# Patient Record
Sex: Female | Born: 1935 | Race: White | Hispanic: No | Smoking: Light tobacco smoker
Health system: Southern US, Community
[De-identification: ages and names within clinical notes are randomized; demographics above are authoritative.]

## PROBLEM LIST (undated history)

## (undated) DIAGNOSIS — E119 Type 2 diabetes mellitus without complications: Secondary | ICD-10-CM

## (undated) DIAGNOSIS — I10 Essential (primary) hypertension: Secondary | ICD-10-CM

## (undated) HISTORY — PX: ABDOMINAL HYSTERECTOMY: SHX81

---

## 2004-12-29 ENCOUNTER — Ambulatory Visit: Payer: Self-pay | Admitting: General Surgery

## 2005-01-04 ENCOUNTER — Ambulatory Visit: Payer: Self-pay | Admitting: General Surgery

## 2005-03-02 ENCOUNTER — Inpatient Hospital Stay: Payer: Self-pay | Admitting: General Surgery

## 2006-02-27 ENCOUNTER — Ambulatory Visit: Payer: Self-pay | Admitting: General Surgery

## 2006-03-28 ENCOUNTER — Other Ambulatory Visit: Payer: Self-pay

## 2006-04-05 ENCOUNTER — Inpatient Hospital Stay: Payer: Self-pay | Admitting: General Surgery

## 2008-06-16 ENCOUNTER — Inpatient Hospital Stay: Payer: Self-pay | Admitting: Vascular Surgery

## 2009-03-18 ENCOUNTER — Inpatient Hospital Stay: Payer: Self-pay | Admitting: Vascular Surgery

## 2009-06-01 ENCOUNTER — Ambulatory Visit: Payer: Self-pay | Admitting: Gastroenterology

## 2009-09-23 ENCOUNTER — Ambulatory Visit: Payer: Self-pay | Admitting: Ophthalmology

## 2011-01-24 ENCOUNTER — Ambulatory Visit: Payer: Self-pay | Admitting: Vascular Surgery

## 2013-04-01 ENCOUNTER — Ambulatory Visit: Payer: Self-pay | Admitting: Vascular Surgery

## 2013-04-01 LAB — BASIC METABOLIC PANEL
Anion Gap: 6 — ABNORMAL LOW (ref 7–16)
Calcium, Total: 9.5 mg/dL (ref 8.5–10.1)
Chloride: 105 mmol/L (ref 98–107)
Creatinine: 1.08 mg/dL (ref 0.60–1.30)
EGFR (Non-African Amer.): 46 — ABNORMAL LOW
Glucose: 227 mg/dL — ABNORMAL HIGH (ref 65–99)
Osmolality: 288 (ref 275–301)
Potassium: 4 mmol/L (ref 3.5–5.1)

## 2014-10-31 NOTE — Op Note (Signed)
PATIENT NAME:  Carol Kramer, Carol Kramer MR#:  [id number] DATE OF BIRTH:  01/10/1924  DATE OF PROCEDURE:  04/01/2013  PREOPERATIVE DIAGNOSES:  1.  Peripheral arterial disease with a very short distance claudication, bilateral lower extremities.  2.  Tobacco dependence.  3.  Hypertension.  4.  Diabetes.   POSTOPERATIVE DIAGNOSES:  1.  Peripheral arterial disease with a very short distance claudication, bilateral lower extremities.  2.  Tobacco dependence.  3.  Hypertension.  4.  Diabetes.   PROCEDURE:  1.  Ultrasound guidance for vascular access, left femoral artery.  2.  Catheter placement into right posterior tibial artery from left femoral approach.  3.  Aortogram and selective right lower extremity angiogram. 4.  Percutaneous transluminal angioplasty of left distal external iliac artery with 5 mm diameter angioplasty balloon.  5.  Percutaneous transluminal angioplasty of right distal popliteal artery, tibioperoneal trunk and posterior tibial artery with 3 mm diameter angioplasty balloon.  6.  Percutaneous transluminal angioplasty of right popliteal artery and entire length of superficial femoral artery with 5 mm diameter angioplasty balloon.  7.  Two self-expanding stent placements in the right superficial femoral artery for greater than 50% residual stenosis, dissection and arteriovenous fistula after angioplasty.  8.  Percutaneous transluminal angioplasty of right external iliac artery with 7 mm diameter angioplasty balloon.  9.  Self-expanding stent placement to right external iliac artery for greater than 50% residual stenosis after angioplasty.  10. StarClose closure device, left femoral artery.   SURGEON: Festus Barren, M.D.   ANESTHESIA: Local with moderate conscious sedation.   ESTIMATED BLOOD LOSS: 25 mL.  CONTRAST USED: 95 mL Visipaque.   FLUOROSCOPY TIME: About 15 minutes.   INDICATION FOR PROCEDURE: An 81 year old white female with extensive peripheral vascular disease. She has  had previous interventions in years past. She has very advanced severe disease and now has pain with any activity. It is difficult to discern if  she has true ischemic rest pain as her ABIs are quite low and her disease is quite extensive. She has become debilitated to the point where she is unable to walk at all at this point and asks if there is anything we would be able to do for revascularization. I told her that our revascularization options may be limited, but we could certainly perform and angiogram and see what options would be available. Both legs are affected and we are evaluating and treating. We are looking at the right lower extremity to see if any revascularization options exist. Risks and benefits were discussed. Informed consent was obtained.   DESCRIPTION OF PROCEDURE: The patient is brought to the vascular interventional radiology suite. The groin was shaved and prepped and a sterile surgical field was created. The left femoral head was localized with fluoroscopy. Ultrasound was used to visualize the left femoral artery. This was accessed and a micropuncture wire was placed. A micropuncture sheath was then placed and we were able to up size it to a 5-French sheath. Initially the J-wire would not track and on imaging it was seen that the distal external iliac artery on the left had a high-grade stenosis. Before up sizing to a larger sheath and before treating the right, we angioplastied this area with a 5 mm diameter angioplasty balloon pulling the sheath back. This still left a 50% residual stenosis, but it was improved and we were not in any location to place a stent from the left femoral approach. We then put a pigtail catheter in the aorta and aortogram  was performed. This demonstrated very calcific aorta and iliac vessels. The right external iliac artery and its origin had about a 60% to 70% stenosis. I then hooked the aortic bifurcation and advanced to the right femoral head. Selective right  lower extremity was then performed. This showed a flush occlusion of the right superficial femoral artery with essentially no runoff distally. The distal anterior tibial artery did reconstitute, but her flow was quite poor. A 6-French Ansell sheath was placed over a Terumo Advantage wire and we tediously tried to cross the lesions. I was able to gain access to the superficial femoral artery. I got a catheter all the way down without too much difficulty into the popliteal artery. At this location,  I could not traverse into the anterior tibial artery, but the wire preferentially went into the posterior tibial artery on many passes. I elected to go ahead and treat this down as I could get a wire all the way down to the ankle. We exchanged for an 0.018 wire and a CXI catheter.   A 3 mm diameter angioplasty balloon was then inflated from just above the ankle, the posterior tibial artery up through the tibioperoneal trunk and into the distal popliteal artery. This did seem to improve the runoff and mostly through collaterals in the lower part of the leg. A 5 mm diameter angioplasty balloon was inflated from the below-knee popliteal artery up to the common femoral artery. Following this, the entire superficial femoral artery was essentially dissection with stenosis and an AV fistula. I exchanged back for the a 0.35 wire and placed 2 long 6 mm diameter stents basically from Hunter's canal to the proximal superficial femoral artery 2 to 3 cm beyond its origin. This was a better, although there was still some residual disease proximally and some finding that may be fistula residual. The flow did seem to be improved.   I then turned my attention to the iliac lesion. A 7 mm diameter angioplasty balloon was used to treat the right iliac lesion. This resulted in no improvement, so an 8 mm diameter x 6 cm length stent was placed and then ironed out with a 7 mm balloon with significantly improved flow and about a 30% residual  stenosis. At this point, I elected to terminate the procedure. The sheath was removed. StarClose closure device was deployed in the usual fashion under fluoroscopic guidance to avoid catching on the distal external iliac lesion we had previously treated. The patient tolerated the procedure well and was taken to the recovery room in stable condition.    ____________________________ Annice NeedyJason S. Pheonix Wisby, MD jsd:aw D: 04/01/2013 13:19:37 ET T: 04/01/2013 14:10:59 ET JOB#: [id number]  cc: Annice NeedyJason S. Termaine Roupp, MD, <Dictator> Annice NeedyJASON S Mattalynn Crandle MD ELECTRONICALLY SIGNED 04/03/2013 12:58

## 2016-06-08 ENCOUNTER — Other Ambulatory Visit: Payer: Self-pay | Admitting: Family Medicine

## 2016-06-08 DIAGNOSIS — I639 Cerebral infarction, unspecified: Secondary | ICD-10-CM

## 2016-06-09 ENCOUNTER — Inpatient Hospital Stay
Admission: EM | Admit: 2016-06-09 | Discharge: 2016-06-11 | DRG: 066 | Disposition: A | Discharge status: Home Health | Payer: Medicare Other | Attending: Internal Medicine | Admitting: Internal Medicine

## 2016-06-09 ENCOUNTER — Emergency Department: Payer: Medicare Other

## 2016-06-09 DIAGNOSIS — E1151 Type 2 diabetes mellitus with diabetic peripheral angiopathy without gangrene: Secondary | ICD-10-CM | POA: Diagnosis present

## 2016-06-09 DIAGNOSIS — R402362 Coma scale, best motor response, obeys commands, at arrival to emergency department: Secondary | ICD-10-CM | POA: Diagnosis present

## 2016-06-09 DIAGNOSIS — Z7902 Long term (current) use of antithrombotics/antiplatelets: Secondary | ICD-10-CM | POA: Diagnosis not present

## 2016-06-09 DIAGNOSIS — R402252 Coma scale, best verbal response, oriented, at arrival to emergency department: Secondary | ICD-10-CM | POA: Diagnosis present

## 2016-06-09 DIAGNOSIS — H538 Other visual disturbances: Secondary | ICD-10-CM | POA: Diagnosis present

## 2016-06-09 DIAGNOSIS — R42 Dizziness and giddiness: Secondary | ICD-10-CM | POA: Diagnosis present

## 2016-06-09 DIAGNOSIS — Z7984 Long term (current) use of oral hypoglycemic drugs: Secondary | ICD-10-CM | POA: Diagnosis not present

## 2016-06-09 DIAGNOSIS — E785 Hyperlipidemia, unspecified: Secondary | ICD-10-CM | POA: Diagnosis present

## 2016-06-09 DIAGNOSIS — Z7982 Long term (current) use of aspirin: Secondary | ICD-10-CM | POA: Diagnosis not present

## 2016-06-09 DIAGNOSIS — I63532 Cerebral infarction due to unspecified occlusion or stenosis of left posterior cerebral artery: Secondary | ICD-10-CM | POA: Diagnosis not present

## 2016-06-09 DIAGNOSIS — R4182 Altered mental status, unspecified: Secondary | ICD-10-CM | POA: Diagnosis present

## 2016-06-09 DIAGNOSIS — R2681 Unsteadiness on feet: Secondary | ICD-10-CM | POA: Diagnosis present

## 2016-06-09 DIAGNOSIS — Z8249 Family history of ischemic heart disease and other diseases of the circulatory system: Secondary | ICD-10-CM

## 2016-06-09 DIAGNOSIS — I639 Cerebral infarction, unspecified: Secondary | ICD-10-CM | POA: Diagnosis present

## 2016-06-09 DIAGNOSIS — R29702 NIHSS score 2: Secondary | ICD-10-CM | POA: Diagnosis present

## 2016-06-09 DIAGNOSIS — H53461 Homonymous bilateral field defects, right side: Secondary | ICD-10-CM

## 2016-06-09 DIAGNOSIS — R402142 Coma scale, eyes open, spontaneous, at arrival to emergency department: Secondary | ICD-10-CM | POA: Diagnosis present

## 2016-06-09 DIAGNOSIS — F17200 Nicotine dependence, unspecified, uncomplicated: Secondary | ICD-10-CM | POA: Diagnosis present

## 2016-06-09 DIAGNOSIS — I10 Essential (primary) hypertension: Secondary | ICD-10-CM | POA: Diagnosis present

## 2016-06-09 HISTORY — DX: Type 2 diabetes mellitus without complications: E11.9

## 2016-06-09 HISTORY — DX: Essential (primary) hypertension: I10

## 2016-06-09 LAB — URINALYSIS COMPLETE WITH MICROSCOPIC (ARMC ONLY)
BILIRUBIN URINE: NEGATIVE
GLUCOSE, UA: 50 mg/dL — AB
Hgb urine dipstick: NEGATIVE
KETONES UR: NEGATIVE mg/dL
NITRITE: NEGATIVE
Protein, ur: NEGATIVE mg/dL
Specific Gravity, Urine: 1.015 (ref 1.005–1.030)
pH: 5 (ref 5.0–8.0)

## 2016-06-09 LAB — GLUCOSE, CAPILLARY
GLUCOSE-CAPILLARY: 121 mg/dL — AB (ref 65–99)
Glucose-Capillary: 75 mg/dL (ref 65–99)
Glucose-Capillary: 167 mg/dL — ABNORMAL HIGH (ref 65–99)

## 2016-06-09 LAB — CBC
HCT: 46.6 % (ref 35.0–47.0)
Hemoglobin: 15.7 g/dL (ref 12.0–16.0)
MCH: 28.6 pg (ref 26.0–34.0)
MCHC: 33.8 g/dL (ref 32.0–36.0)
MCV: 84.5 fL (ref 80.0–100.0)
Platelets: 196 10*3/uL (ref 150–440)
RBC: 5.51 MIL/uL — ABNORMAL HIGH (ref 3.80–5.20)
RDW: 13.5 % (ref 11.5–14.5)
WBC: 6.2 10*3/uL (ref 3.6–11.0)

## 2016-06-09 LAB — BASIC METABOLIC PANEL
ANION GAP: 10 (ref 5–15)
BUN: 54 mg/dL — AB (ref 6–20)
CALCIUM: 9.3 mg/dL (ref 8.9–10.3)
CO2: 27 mmol/L (ref 22–32)
Chloride: 100 mmol/L — ABNORMAL LOW (ref 101–111)
Creatinine, Ser: 1.3 mg/dL — ABNORMAL HIGH (ref 0.44–1.00)
GFR calc Af Amer: 41 mL/min — ABNORMAL LOW (ref >60)
GFR, EST NON AFRICAN AMERICAN: 35 mL/min — AB (ref >60)
GLUCOSE: 99 mg/dL (ref 65–99)
Potassium: 4.1 mmol/L (ref 3.5–5.1)
SODIUM: 137 mmol/L (ref 135–145)

## 2016-06-09 LAB — TROPONIN I: Troponin I: <0.03 ng/mL (ref <0.03)

## 2016-06-09 MED ORDER — GLIPIZIDE 5 MG PO TABS
5.0000 mg | ORAL_TABLET | Freq: Every day | ORAL | Status: DC
  Filled 2016-06-09: qty 1

## 2016-06-09 MED ORDER — ACETAMINOPHEN 325 MG PO TABS: 650.0000 mg | ORAL_TABLET | ORAL | Status: DC | PRN

## 2016-06-09 MED ORDER — STROKE: EARLY STAGES OF RECOVERY BOOK
Freq: Once | Status: AC
  Administered 2016-06-09: 17:00:00

## 2016-06-09 MED ORDER — CLOPIDOGREL BISULFATE 75 MG PO TABS: 75.0000 mg | ORAL_TABLET | Freq: Every day | ORAL | Status: DC

## 2016-06-09 MED ORDER — ENOXAPARIN SODIUM 30 MG/0.3ML ~~LOC~~ SOLN
30.0000 mg | SUBCUTANEOUS | Status: DC
  Administered 2016-06-09 – 2016-06-10 (×2): 30 mg via SUBCUTANEOUS
  Filled 2016-06-09 (×3): qty 0.3

## 2016-06-09 MED ORDER — ASPIRIN EC 81 MG PO TBEC
81.0000 mg | DELAYED_RELEASE_TABLET | Freq: Every day | ORAL | Status: DC
  Administered 2016-06-10 – 2016-06-11 (×2): 81 mg via ORAL
  Filled 2016-06-09 (×3): qty 1

## 2016-06-09 MED ORDER — METFORMIN HCL 850 MG PO TABS
850.0000 mg | ORAL_TABLET | Freq: Two times a day (BID) | ORAL | Status: DC
  Administered 2016-06-09 – 2016-06-11 (×4): 850 mg via ORAL
  Filled 2016-06-09 (×4): qty 1

## 2016-06-09 MED ORDER — LABETALOL HCL 5 MG/ML IV SOLN
10.0000 mg | INTRAVENOUS | Status: DC | PRN
  Administered 2016-06-10: 01:00:00 10 mg via INTRAVENOUS
  Filled 2016-06-09 (×2): qty 4

## 2016-06-09 MED ORDER — SODIUM CHLORIDE 0.9 % IV BOLUS (SEPSIS)
1000.0000 mL | Freq: Once | INTRAVENOUS | Status: AC
  Administered 2016-06-09: 1000 mL via INTRAVENOUS

## 2016-06-09 MED ORDER — PRAVASTATIN SODIUM 40 MG PO TABS
40.0000 mg | ORAL_TABLET | Freq: Every day | ORAL | Status: DC
  Administered 2016-06-09 – 2016-06-10 (×2): 40 mg via ORAL
  Filled 2016-06-09 (×2): qty 1

## 2016-06-09 MED ORDER — OMEGA-3-ACID ETHYL ESTERS 1 G PO CAPS
1.0000 g | ORAL_CAPSULE | Freq: Every day | ORAL | Status: DC
Start: 2016-06-09 — End: 2016-06-11
  Administered 2016-06-09 – 2016-06-11 (×3): 1 g via ORAL
  Filled 2016-06-09 (×3): qty 1

## 2016-06-09 MED ORDER — CLOPIDOGREL BISULFATE 75 MG PO TABS
75.0000 mg | ORAL_TABLET | Freq: Every day | ORAL | Status: DC
  Administered 2016-06-09 – 2016-06-11 (×3): 75 mg via ORAL
  Filled 2016-06-09 (×3): qty 1

## 2016-06-09 MED ORDER — ACETAMINOPHEN 650 MG RE SUPP: 650.0000 mg | RECTAL | Status: DC | PRN

## 2016-06-09 MED ORDER — HYDROCHLOROTHIAZIDE 25 MG PO TABS
25.0000 mg | ORAL_TABLET | Freq: Every day | ORAL | Status: DC
  Administered 2016-06-10 – 2016-06-11 (×2): 25 mg via ORAL
  Filled 2016-06-09 (×2): qty 1

## 2016-06-09 MED ORDER — INSULIN ASPART 100 UNIT/ML ~~LOC~~ SOLN
0.0000 [IU] | Freq: Three times a day (TID) | SUBCUTANEOUS | Status: DC
  Administered 2016-06-09: 18:00:00 2 [IU] via SUBCUTANEOUS
  Administered 2016-06-10: 1 [IU] via SUBCUTANEOUS
  Administered 2016-06-10: 3 [IU] via SUBCUTANEOUS
  Administered 2016-06-10: 17:00:00 2 [IU] via SUBCUTANEOUS
  Administered 2016-06-11 (×2): 1 [IU] via SUBCUTANEOUS
  Filled 2016-06-09 (×3): qty 1
  Filled 2016-06-09: qty 3
  Filled 2016-06-09 (×2): qty 2

## 2016-06-09 MED ORDER — PIOGLITAZONE HCL 15 MG PO TABS
45.0000 mg | ORAL_TABLET | Freq: Every day | ORAL | Status: DC
  Administered 2016-06-10 – 2016-06-11 (×2): 45 mg via ORAL
  Filled 2016-06-09: qty 3
  Filled 2016-06-09: qty 1
  Filled 2016-06-09 (×2): qty 3

## 2016-06-09 MED ORDER — LISINOPRIL 20 MG PO TABS
40.0000 mg | ORAL_TABLET | Freq: Every day | ORAL | Status: DC
  Administered 2016-06-09 – 2016-06-11 (×3): 40 mg via ORAL
  Filled 2016-06-09 (×3): qty 2

## 2016-06-09 MED ORDER — ASPIRIN 81 MG PO CHEW
324.0000 mg | CHEWABLE_TABLET | Freq: Once | ORAL | Status: AC
  Administered 2016-06-09: 324 mg via ORAL
  Filled 2016-06-09: qty 4

## 2016-06-09 NOTE — ED Provider Notes (Signed)
[facility] Emergency Department Provider Note  ____________________________________________  Time seen: Approximately 12:11 PM  I have reviewed the triage vital signs and the nursing notes.   HISTORY  Chief Complaint Extremity Weakness   HPI Carol Kramer is a 82 y.o. female with a history of non-insulin-dependent diabetes, hypertension, hyperlipidemia, PVD on plavix who presents for evaluation of altered mental status. According to the family patient has been confused for the last 5 days. She saw her PCP 2 days ago and there was some concern the patient had had a stroke as she was noted by family members to have a right-sided weakness. Patient has no history of prior strokes. She is on Plavix. Patient was not taking her medications over the weekend as well. Patient tells me that she feels very confused for the last 3 days but endorses compliance with her medications. She denies any weakness at this time, headache, chest pain, shortness of breath, abdominal pain, nausea, vomiting, diarrhea, dysuria, hematuria, fever. Today patient's niece noticed that she had slurred speech and right-sided weakness which prompted EMS to be called. When EMS arrived patient did not meet stroke criteria per their evaluation.  Past Medical History:  Diagnosis Date  . Diabetes mellitus without complication (HCC)   . Hypertension     There are no active problems to display for this patient.   Past Surgical History:  Procedure Laterality Date  . ABDOMINAL HYSTERECTOMY      Prior to Admission medications   Not on File    Allergies Patient has no known allergies.  No family history on file.  Social History Social History  Substance Use Topics  . Smoking status: Light Tobacco Smoker  . Smokeless tobacco: Not on file  . Alcohol use No    Review of Systems  Constitutional: Negative for fever. + confusion Eyes: Negative for visual changes. ENT: Negative for sore  throat. Neck: No neck pain  Cardiovascular: Negative for chest pain. Respiratory: Negative for shortness of breath. Gastrointestinal: Negative for abdominal pain, vomiting or diarrhea. Genitourinary: Negative for dysuria. Musculoskeletal: Negative for back pain. Skin: Negative for rash. Neurological: Negative for headaches, weakness or numbness. Psych: No SI or HI  ____________________________________________   PHYSICAL EXAM:  VITAL SIGNS: ED Triage Vitals  Enc Vitals Group     BP 06/09/16 1149 (!) 174/80     Pulse Rate 06/09/16 1149 74     Resp 06/09/16 1149 16     Temp 06/09/16 1149 98.3 F (36.8 C)     Temp Source 06/09/16 1149 Oral     SpO2 06/09/16 1149 95 %     Weight 06/09/16 1150 140 lb (63.5 kg)     Height 06/09/16 1150 5\' 4"  (1.626 m)     Head Circumference --      Peak Flow --      Pain Score 06/09/16 1150 0     Pain Loc --      Pain Edu? --      Excl. in GC? --     Constitutional: Alert and oriented x 2. Well appearing and in no apparent distress. HEENT:      Head: Normocephalic and atraumatic.         Eyes: Conjunctivae are normal. Sclera is non-icteric. EOMI. PERRL      Mouth/Throat: Mucous membranes are dry.       Neck: Supple with no signs of meningismus. Cardiovascular: Regular rate and rhythm. No murmurs, gallops, or rubs. 2+ symmetrical distal pulses  are present in all extremities. No JVD. Respiratory: Normal respiratory effort. Lungs are clear to auscultation bilaterally. No wheezes, crackles, or rhonchi.  Gastrointestinal: Soft, non tender, and non distended with positive bowel sounds. No rebound or guarding. Genitourinary: No CVA tenderness. Musculoskeletal: Nontender with normal range of motion in all extremities. No edema, cyanosis, or erythema of extremities. Neurologic: Normal speech and language. A & O x2, PERRL, no nystagmus, CN II-XII intact, motor testing reveals good tone and bulk throughout. There is no evidence of pronator drift or  dysmetria. Muscle strength is 5/5 throughout. Deep tendon reflexes are 2+ throughout with downgoing toes. Sensory examination is intact. Gait deferred. Skin: Skin is warm, dry and intact. No rash noted. Psychiatric: Mood and affect are normal. Speech and behavior are normal.  ____________________________________________   LABS (all labs ordered are listed, but only abnormal results are displayed)  Labs Reviewed  BASIC METABOLIC PANEL - Abnormal; Notable for the following:       Result Value   Chloride 100 (*)    BUN 54 (*)    Creatinine, Ser 1.30 (*)    GFR calc non Af Amer 35 (*)    GFR calc Af Amer 41 (*)    All other components within normal limits  CBC - Abnormal; Notable for the following:    RBC 5.51 (*)    All other components within normal limits  GLUCOSE, CAPILLARY  TROPONIN I  URINALYSIS COMPLETEWITH MICROSCOPIC ([facility] ONLY)  CBG MONITORING, ED   ____________________________________________  EKG  ED ECG REPORT I, Nita Sicklearolina Sofiya Ezelle, the attending physician, personally viewed and interpreted this ECG.  Normal sinus rhythm, rate of 72, first-degree AV block, normal QRS and QTc intervals, normal axis, no ST elevations or depressions. ____________________________________________  RADIOLOGY  Head CT: Changes consistent with likely subacute ischemia in the left occipital lobe  CXR: No active cardiopulmonary disease. ____________________________________________   PROCEDURES  Procedure(s) performed: None Procedures Critical Care performed: yes  CRITICAL CARE Performed by: Nita Sicklearolina Kanda Deluna  ?  Total critical care time: 35 min  Critical care time was exclusive of separately billable procedures and treating other patients.  Critical care was necessary to treat or prevent imminent or life-threatening deterioration.  Critical care was time spent personally by me on the following activities: development of treatment plan with patient and/or surrogate as well  as nursing, discussions with consultants, evaluation of patient's response to treatment, examination of patient, obtaining history from patient or surrogate, ordering and performing treatments and interventions, ordering and review of laboratory studies, ordering and review of radiographic studies, pulse oximetry and re-evaluation of patient's condition.  ____________________________________________   INITIAL IMPRESSION / ASSESSMENT AND PLAN / ED COURSE  82 y.o. female with a history of non-insulin-dependent diabetes, hypertension, hyperlipidemia, PVD on plavix who presents for evaluation of altered mental status x 5 days and intermittent episodes of slurred speech and R sided weakness. Patient at this time has no slurred speech, full strength in all 4 extremities, no neuro deficits were observed. She does look a little bit dehydrated with dry mucous membranes. We'll check blood work, urinalysis, chest x-ray, head CT.  Clinical Course as of Jun 09 1412  Thu Jun 09, 2016  1404 CT concerning for subacute occipital stroke. Will discuss with neurology  [CV]  1413 Dr. Thad Rangereynolds from neurology recommended admission for further evaluation. Patient be given a full dose of aspirin and will be admitted to the hospitalist service.  [CV]    Clinical Course User Index [CV] Nita Sicklearolina Tremond Shimabukuro,  MD    Pertinent labs & imaging results that were available during my care of the patient were reviewed by me and considered in my medical decision making (see chart for details).    ____________________________________________   FINAL CLINICAL IMPRESSION(S) / ED DIAGNOSES  Final diagnoses:  Ischemic stroke (HCC)      NEW MEDICATIONS STARTED DURING THIS VISIT:  New Prescriptions   No medications on file     Note:  This document was prepared using Dragon voice recognition software and may include unintentional dictation errors.    Nita Sickle, MD 06/09/16 [phone]

## 2016-06-09 NOTE — ED Notes (Signed)
Pt drank water with out straw and with straw with no difficulty. Ate cracker with no difficulty. No change in breath sounds.

## 2016-06-09 NOTE — H&P (Signed)
[facility] [place] [facility]   PATIENT NAME: Carol CovertDorothy Kramer    MR#:  [id number]  DATE OF BIRTH:  1923/12/14  DATE OF ADMISSION:  06/09/2016  PRIMARY CARE PHYSICIAN: BLISS, Doreene NestLAURA K, MD   REQUESTING/REFERRING PHYSICIAN: Dr Don PerkingVeronese  CHIEF COMPLAINT:   Not feeling well and blurred vision both eyes right more than left along with dizziness. HISTORY OF PRESENT ILLNESS:  Carol CovertDorothy Kramer  is a 82 y.o. female with a known history of Diabetes, hypertension comes to the emergency room from home after she started noticing blurred vision along with dizziness and unsteady gait. Patient sees her blurred vision was more on the right side than the left. Family noted patient went driving in her car did not end up coming to the family gathering and got worried and brought her to the emergency room and workup in the ER showed patient has subacute left occipital stroke. Patient takes aspirin and Plavix. She is being admitted for further evaluation and management. Denies any focal weakness or dysphagia.  PAST MEDICAL HISTORY:   Past Medical History:  Diagnosis Date  . Diabetes mellitus without complication (HCC)   . Hypertension     PAST SURGICAL HISTOIRY:   Past Surgical History:  Procedure Laterality Date  . ABDOMINAL HYSTERECTOMY      SOCIAL HISTORY:   Social History  Substance Use Topics  . Smoking status: Light Tobacco Smoker  . Smokeless tobacco: Not on file  . Alcohol use No    FAMILY HISTORY:  No family history on file.  DRUG ALLERGIES:  No Known Allergies  REVIEW OF SYSTEMS:  Review of Systems  Constitutional: Negative for chills, fever and weight loss.  HENT: Negative for ear discharge, ear pain and nosebleeds.   Eyes: Negative for blurred vision, pain and discharge.  Respiratory: Negative for sputum production, shortness of breath, wheezing and stridor.   Cardiovascular: Negative for chest pain, palpitations, orthopnea and PND.   Gastrointestinal: Negative for abdominal pain, diarrhea, nausea and vomiting.  Genitourinary: Negative for frequency and urgency.  Musculoskeletal: Negative for back pain and joint pain.  Neurological: Positive for dizziness and weakness. Negative for sensory change, speech change and focal weakness.       Blurred vision right more than left  Psychiatric/Behavioral: Negative for depression and hallucinations. The patient is not nervous/anxious.      MEDICATIONS AT HOME:   Prior to Admission medications   Medication Sig Start Date End Date Taking? Authorizing Provider  aspirin EC 81 MG tablet Take 81 mg by mouth daily.   Yes Historical Provider, MD  clopidogrel (PLAVIX) 75 MG tablet Take 75 mg by mouth daily.   Yes Historical Provider, MD  glipiZIDE (GLUCOTROL) 5 MG tablet Take 5 mg by mouth daily.   Yes Historical Provider, MD  hydrochlorothiazide (HYDRODIURIL) 25 MG tablet Take 25 mg by mouth daily.   Yes Historical Provider, MD  Boris LownKrill Oil 300 MG CAPS Take 1 capsule by mouth daily.   Yes Historical Provider, MD  lisinopril (PRINIVIL,ZESTRIL) 40 MG tablet Take 40 mg by mouth daily.   Yes Historical Provider, MD  lovastatin (MEVACOR) 40 MG tablet Take 40 mg by mouth at bedtime.   Yes Historical Provider, MD  metFORMIN (GLUCOPHAGE) 850 MG tablet Take 850 mg by mouth 2 (two) times daily.   Yes Historical Provider, MD  pioglitazone (ACTOS) 45 MG tablet Take 45 mg by mouth daily.   Yes Historical Provider, MD      VITAL SIGNS:  Blood pressure (!) 165/66, pulse 78, temperature 98.1 F (36.7 C), temperature source Oral, resp. rate 16, height 5\' 4"  (1.626 m), weight 64.2 kg (141 lb 9.6 oz), SpO2 97 %.  PHYSICAL EXAMINATION:  GENERAL:  82 y.o.-year-old patient lying in the bed with no acute distress.  EYES: Pupils equal, round, reactive to light and accommodation. No scleral icterus. Extraocular muscles intact.  HEENT: Head atraumatic, normocephalic. Oropharynx and nasopharynx clear.  NECK:   Supple, no jugular venous distention. No thyroid enlargement, no tenderness.  LUNGS: Normal breath sounds bilaterally, no wheezing, rales,rhonchi or crepitation. No use of accessory muscles of respiration.  CARDIOVASCULAR: S1, S2 normal. No murmurs, rubs, or gallops.  ABDOMEN: Soft, nontender, nondistended. Bowel sounds present. No organomegaly or mass.  EXTREMITIES: No pedal edema, cyanosis, or clubbing.  NEUROLOGIC: Cranial nerves II through XII are intact. Muscle strength 5/5 in all extremities. Sensation intact. Gait not checked.  PSYCHIATRIC: The patient is alert and oriented x 3.  SKIN: No obvious rash, lesion, or ulcer.   LABORATORY PANEL:   CBC  Recent Labs Lab 06/09/16 1155  WBC 6.2  HGB 15.7  HCT 46.6  PLT 196   ------------------------------------------------------------------------------------------------------------------  Chemistries   Recent Labs Lab 06/09/16 1155  NA 137  K 4.1  CL 100*  CO2 27  GLUCOSE 99  BUN 54*  CREATININE 1.30*  CALCIUM 9.3   ------------------------------------------------------------------------------------------------------------------  Cardiac Enzymes  Recent Labs Lab 06/09/16 1155  TROPONINI <0.03   ------------------------------------------------------------------------------------------------------------------  RADIOLOGY:  Dg Chest 2 View  Result Date: 06/09/2016 CLINICAL DATA:  Confusion, altered mental status and slurred speech. EXAM: CHEST  2 VIEW COMPARISON:  06/17/2008 FINDINGS: The cardiomediastinal silhouette is unremarkable. Mild interstitial prominence is unchanged. There is no evidence of focal airspace disease, pulmonary edema, suspicious pulmonary nodule/mass, pleural effusion, or pneumothorax. No acute bony abnormalities are identified. IMPRESSION: No active cardiopulmonary disease. Electronically Signed   By: Harmon PierJeffrey  Hu M.D.   On: 06/09/2016 13:36   Ct Head Wo Contrast  Result Date:  06/09/2016 CLINICAL DATA:  Right-sided weakness and slurred speech EXAM: CT HEAD WITHOUT CONTRAST TECHNIQUE: Contiguous axial images were obtained from the base of the skull through the vertex without intravenous contrast. COMPARISON:  None. FINDINGS: Brain: Mild atrophic changes are identified. There is a geographic area of decreased attenuation identified in the left occipital lobe consistent with subacute ischemia. No other area of acute ischemia is seen. No findings to suggest acute hemorrhage or space-occupying mass lesion are noted. Vascular: No hyperdense vessel or unexpected calcification. Skull: Normal. Negative for fracture or focal lesion. Sinuses/Orbits: No acute finding. Other: None. IMPRESSION: Changes consistent with likely subacute ischemia in the left occipital lobe No other focal abnormality is noted. Electronically Signed   By: Alcide CleverMark  Lukens M.D.   On: 06/09/2016 13:56    EKG:    IMPRESSION AND PLAN:    Carol CovertDorothy Kramer  is a 82 y.o. female with a known history of Diabetes, hypertension comes to the emergency room from home after she started noticing blurred vision along with dizziness and unsteady gait. Patient sees her blurred vision was more on the right side than the lef  1. Acute CVA, left occipital -Patient presented with blurred vision bilateral right more than left along with dizziness and unsteady gait -Admit to medical floor -Continue aspirin and Plavix -Neuro consult -Carotid Doppler, echo -I will hold off on MRI MRA of the brain unless neuro fields otherwise  2. Hypertension -Continue hydrochlorothiazide, lisinopril  3. Diabetes Continue glipizide, metformin,  Actos -Sliding-scale insulin  4. Hyperlipidemia on lovastatin  5. DVT prophylaxis subcutaneous Lovenox  Physical therapy to see patient  Management for discharge planning  All the records are reviewed and case discussed with ED provider. Management plans discussed with the patient, family and they  are in agreement.  CODE STATUS: Full code this was discussed with patient  TOTAL TIME TAKING CARE OF THIS PATIENT: 50 minutes.    Jazz Biddy M.D on 06/09/2016 at 5:27 PM  Between 7am to 6pm - Pager - [phone]  After 6pm go to [email or web address] - password [email or web address] [facility]  [facility]  Office  [phone]  CC: Primary care physician; Dortha Kern, MD

## 2016-06-09 NOTE — ED Notes (Signed)
Dr. Patel at bedside 

## 2016-06-09 NOTE — Progress Notes (Signed)
Order for enoxaparin 40 mg subcutaneously daily for DVT prophylaxis changed to 30 mg dose per anticoagulation protocol for CrCl < 30 mL/min.  Cindi CarbonMary M Charnita Trudel, PharmD, BCPS Clinical Pharmacist 06/09/16 4:57 PM

## 2016-06-09 NOTE — ED Notes (Signed)
Pt family member states that pt has not been taking medications like she is supposed to. Saw doctor on Tuesday and he told her that she had a stroke over the weekend. Pt has hx of DM (takes metformin) and HTN.

## 2016-06-09 NOTE — ED Notes (Signed)
Pt given crackers and apple sauce with water.

## 2016-06-09 NOTE — ED Triage Notes (Signed)
Pt BIB [facility] for R sided weakness and slurred speech per family. Per family to EMS pt had R sided weakness on Saturday with slurred speech. Took pt to doctor on Monday, no abnormal findings. According to family to EMS pt was driving around this weekend and didn't show up to a family event. Today family noted R sided weakness and slurred speech and pt unable to walk by self. Pt ambulatory with EMS with assistance. Pt has NO complaints at this time. No weakness noted, no slurred speech noted. Pt is slower to answer questions but is alert and oriented, speaking in complete sentences. EMS VS 180/80, HR 60, CBG 111. Pt lives at home alone.

## 2016-06-09 NOTE — ED Notes (Signed)
Pt provided graham crackers.

## 2016-06-10 ENCOUNTER — Inpatient Hospital Stay
Admit: 2016-06-10 | Discharge: 2016-06-10 | Disposition: A | Discharge status: Home / Self Care | Payer: Medicare Other | Attending: Internal Medicine | Admitting: Internal Medicine

## 2016-06-10 ENCOUNTER — Inpatient Hospital Stay: Payer: Medicare Other

## 2016-06-10 DIAGNOSIS — I639 Cerebral infarction, unspecified: Secondary | ICD-10-CM

## 2016-06-10 LAB — LIPID PANEL
CHOL/HDL RATIO: 4.6 ratio
CHOLESTEROL: 114 mg/dL (ref 0–200)
HDL: 25 mg/dL — AB (ref >40)
LDL Cholesterol: 24 mg/dL (ref 0–99)
TRIGLYCERIDES: 327 mg/dL — AB (ref <150)
VLDL: 65 mg/dL — ABNORMAL HIGH (ref 0–40)

## 2016-06-10 LAB — GLUCOSE, CAPILLARY
GLUCOSE-CAPILLARY: 144 mg/dL — AB (ref 65–99)
Glucose-Capillary: 224 mg/dL — ABNORMAL HIGH (ref 65–99)
Glucose-Capillary: 152 mg/dL — ABNORMAL HIGH (ref 65–99)
Glucose-Capillary: 108 mg/dL — ABNORMAL HIGH (ref 65–99)

## 2016-06-10 LAB — ECHOCARDIOGRAM COMPLETE
HEIGHTINCHES: 64 in
Weight: 2265.6 oz

## 2016-06-10 NOTE — Consult Note (Signed)
Referring Physician: Sherryll Burger    Chief Complaint: Altered mental status  HPI: Carol Kramer is an 82 y.o. female with a history of non-insulin-dependent diabetes, hypertension, hyperlipidemia, PVD on plavix who presents for evaluation of altered mental status. According to the family patient has been confused for the last 5 days. She saw her PCP 2 days ago and there was some concern the patient had had a stroke as she was noted by family members to have a right-sided weakness. Patient has no history of prior strokes. Yesterday patient's niece noticed that she had slurred speech and right-sided weakness which prompted EMS to be called.  Initial NIHSS of 2.  Date last known well: Unable to determine Time last known well: Unable to determine tPA Given: No: Unable to determine LKW  Past Medical History:  Diagnosis Date  . Diabetes mellitus without complication (HCC)   . Hypertension     Past Surgical History:  Procedure Laterality Date  . ABDOMINAL HYSTERECTOMY      Family history: Mother deceased with CAD.  Sister with breast cancer.  Half brother with DM.  Social History:  reports that she has been smoking.  She has quit using smokeless tobacco. She reports that she does not drink alcohol or use drugs.  Allergies: No Known Allergies  Medications:  I have reviewed the patient's current medications. Prior to Admission:  Prescriptions Prior to Admission  Medication Sig Dispense Refill Last Dose  . aspirin EC 81 MG tablet Take 81 mg by mouth daily.   unknown at unknown  . clopidogrel (PLAVIX) 75 MG tablet Take 75 mg by mouth daily.   unknown at unknown  . glipiZIDE (GLUCOTROL) 5 MG tablet Take 5 mg by mouth daily.   unknown at unknown  . hydrochlorothiazide (HYDRODIURIL) 25 MG tablet Take 25 mg by mouth daily.   unknown at unknown  . Krill Oil 300 MG CAPS Take 1 capsule by mouth daily.   unknown at unknown  . lisinopril (PRINIVIL,ZESTRIL) 40 MG tablet Take 40 mg by mouth daily.    unknown at unknown  . lovastatin (MEVACOR) 40 MG tablet Take 40 mg by mouth at bedtime.   unknown at unknown  . metFORMIN (GLUCOPHAGE) 850 MG tablet Take 850 mg by mouth 2 (two) times daily.   unknown at unknown  . pioglitazone (ACTOS) 45 MG tablet Take 45 mg by mouth daily.   unknown at unknown   Scheduled: . aspirin EC  81 mg Oral Daily  . clopidogrel  75 mg Oral Daily  . enoxaparin (LOVENOX) injection  30 mg Subcutaneous Q24H  . hydrochlorothiazide  25 mg Oral Daily  . insulin aspart  0-9 Units Subcutaneous TID WC  . lisinopril  40 mg Oral Daily  . metFORMIN  850 mg Oral BID WC  . omega-3 acid ethyl esters  1 g Oral Daily  . pioglitazone  45 mg Oral Daily  . pravastatin  40 mg Oral q1800    ROS: History obtained from the patient  General ROS: negative for - chills, fatigue, fever, night sweats, weight gain or weight loss Psychological ROS: negative for - behavioral disorder, hallucinations, memory difficulties, mood swings or suicidal ideation Ophthalmic ROS: vision problems ENT ROS: negative for - epistaxis, nasal discharge, oral lesions, sore throat, tinnitus or vertigo Allergy and Immunology ROS: negative for - hives or itchy/watery eyes Hematological and Lymphatic ROS: negative for - bleeding problems, bruising or swollen lymph nodes Endocrine ROS: negative for - galactorrhea, hair pattern changes, polydipsia/polyuria or  temperature intolerance Respiratory ROS: negative for - cough, hemoptysis, shortness of breath or wheezing Cardiovascular ROS: negative for - chest pain, dyspnea on exertion, edema or irregular heartbeat Gastrointestinal ROS: negative for - abdominal pain, diarrhea, hematemesis, nausea/vomiting or stool incontinence Genito-Urinary ROS: negative for - dysuria, hematuria, incontinence or urinary frequency/urgency Musculoskeletal ROS: negative for - joint swelling or muscular weakness Neurological ROS: as noted in HPI Dermatological ROS: negative for rash and  skin lesion changes  Physical Examination: Blood pressure (!) 175/67, pulse 76, temperature 98.6 F (37 C), resp. rate 18, height 5\' 4"  (1.626 m), weight 64.2 kg (141 lb 9.6 oz), SpO2 93 %.  HEENT-  Normocephalic, no lesions, without obvious abnormality.  Normal external eye and conjunctiva.  Normal TM's bilaterally.  Normal auditory canals and external ears. Normal external nose, mucus membranes and septum.  Normal pharynx. Cardiovascular- S1, S2 normal, pulses palpable throughout   Lungs- chest clear, no wheezing, rales, normal symmetric air entry Abdomen- soft, non-tender; bowel sounds normal; no masses,  no organomegaly Extremities- no edema Lymph-no adenopathy palpable Musculoskeletal-no joint tenderness, deformity or swelling Skin-warm and dry, no hyperpigmentation, vitiligo, or suspicious lesions  Neurological Examination Mental Status: Alert, some confusion noted.  Speech fluent without evidence of aphasia.  Able to follow simple commands without difficulty.  Needs some reinforcement for 3-step commands.   Cranial Nerves: II: Discs flat bilaterally; RHH, pupils equal, round, reactive to light and accommodation III,IV, VI: ptosis not present, extra-ocular motions intact bilaterally V,VII: smile symmetric, facial light touch sensation normal bilaterally VIII: hearing normal bilaterally IX,X: gag reflex present XI: bilateral shoulder shrug XII: midline tongue extension Motor: Right : Upper extremity   5/5    Left:     Upper extremity   5/5  Lower extremity   5/5     Lower extremity   5/5 Tone and bulk:normal tone throughout; no atrophy noted Sensory: Pinprick and light touch intact throughout, bilaterally Deep Tendon Reflexes: 2+ and symmetric with absent AJ's bilaterally Plantars: Right: downgoing   Left: upgoing Cerebellar: Normal finger-to-nose and normal heel-to-shin testing bilaterally.  Leaning to the right. Gait: not tested due to safety concerns    Laboratory  Studies:  Basic Metabolic Panel:  Recent Labs Lab 06/09/16 1155  NA 137  K 4.1  CL 100*  CO2 27  GLUCOSE 99  BUN 54*  CREATININE 1.30*  CALCIUM 9.3    Liver Function Tests: No results for input(s): AST, ALT, ALKPHOS, BILITOT, PROT, ALBUMIN in the last 168 hours. No results for input(s): LIPASE, AMYLASE in the last 168 hours. No results for input(s): AMMONIA in the last 168 hours.  CBC:  Recent Labs Lab 06/09/16 1155  WBC 6.2  HGB 15.7  HCT 46.6  MCV 84.5  PLT 196    Cardiac Enzymes:  Recent Labs Lab 06/09/16 1155  TROPONINI <0.03    BNP: Invalid input(s): POCBNP  CBG:  Recent Labs Lab 06/09/16 1216 06/09/16 1704 06/09/16 2048 06/10/16 0741 06/10/16 1224  GLUCAP 75 167* 121* 224* 144*    Microbiology: No results found for this or any previous visit.  Coagulation Studies: No results for input(s): LABPROT, INR in the last 72 hours.  Urinalysis:  Recent Labs Lab 06/09/16 1826  COLORURINE YELLOW*  LABSPEC 1.015  PHURINE 5.0  GLUCOSEU 50*  HGBUR NEGATIVE  BILIRUBINUR NEGATIVE  KETONESUR NEGATIVE  PROTEINUR NEGATIVE  NITRITE NEGATIVE  LEUKOCYTESUR TRACE*    Lipid Panel:    Component Value Date/Time   CHOL 114 06/10/2016 0404  TRIG 327 (H) 06/10/2016 0404   HDL 25 (L) 06/10/2016 0404   CHOLHDL 4.6 06/10/2016 0404   VLDL 65 (H) 06/10/2016 0404   LDLCALC 24 06/10/2016 0404    HgbA1C: No results found for: HGBA1C  Urine Drug Screen:  No results found for: LABOPIA, COCAINSCRNUR, LABBENZ, AMPHETMU, THCU, LABBARB  Alcohol Level: No results for input(s): ETH in the last 168 hours.  Other results: EKG: normal sinus rhythm at 62 bpm.  Imaging: Dg Chest 2 View  Result Date: 06/09/2016 CLINICAL DATA:  Confusion, altered mental status and slurred speech. EXAM: CHEST  2 VIEW COMPARISON:  06/17/2008 FINDINGS: The cardiomediastinal silhouette is unremarkable. Mild interstitial prominence is unchanged. There is no evidence of focal  airspace disease, pulmonary edema, suspicious pulmonary nodule/mass, pleural effusion, or pneumothorax. No acute bony abnormalities are identified. IMPRESSION: No active cardiopulmonary disease. Electronically Signed   By: Harmon Pier M.D.   On: 06/09/2016 13:36   Ct Head Wo Contrast  Result Date: 06/09/2016 CLINICAL DATA:  Right-sided weakness and slurred speech EXAM: CT HEAD WITHOUT CONTRAST TECHNIQUE: Contiguous axial images were obtained from the base of the skull through the vertex without intravenous contrast. COMPARISON:  None. FINDINGS: Brain: Mild atrophic changes are identified. There is a geographic area of decreased attenuation identified in the left occipital lobe consistent with subacute ischemia. No other area of acute ischemia is seen. No findings to suggest acute hemorrhage or space-occupying mass lesion are noted. Vascular: No hyperdense vessel or unexpected calcification. Skull: Normal. Negative for fracture or focal lesion. Sinuses/Orbits: No acute finding. Other: None. IMPRESSION: Changes consistent with likely subacute ischemia in the left occipital lobe No other focal abnormality is noted. Electronically Signed   By: Alcide Clever M.D.   On: 06/09/2016 13:56   US Carotid Bilateral (at [facility] Only)  Result Date: 06/10/2016 CLINICAL DATA:  CVA. EXAM: BILATERAL CAROTID DUPLEX ULTRASOUND TECHNIQUE: Wallace Cullens scale imaging, color Doppler and duplex ultrasound were performed of bilateral carotid and vertebral arteries in the neck. COMPARISON:  No recent prior. FINDINGS: Criteria: Quantification of carotid stenosis is based on velocity parameters that correlate the residual internal carotid diameter with NASCET-based stenosis levels, using the diameter of the distal internal carotid lumen as the denominator for stenosis measurement. The following velocity measurements were obtained: RIGHT ICA:  81/9 cm/sec CCA:  92/8 cm/sec SYSTOLIC ICA/CCA RATIO:  1.0 DIASTOLIC ICA/CCA RATIO:  1.5 ECA:  221  cm/sec LEFT ICA:  129/20 cm/sec CCA:  84/10 cm/sec SYSTOLIC ICA/CCA RATIO:  1.5 DIASTOLIC ICA/CCA RATIO:  2.0 ECA:  161 cm/sec RIGHT CAROTID ARTERY: Mild right common carotid and carotid bifurcation/proximal ICA atherosclerotic vascular disease with mild calcified atherosclerotic plaque at noted at the right carotid bifurcation. No flow limiting stenosis. RIGHT VERTEBRAL ARTERY:  Patent with antegrade flow. LEFT CAROTID ARTERY: Mild left carotid bifurcation proximal ICA atherosclerotic vascular disease with mild calcified atherosclerotic plaque at the left carotid bifurcation proximal ICA. LEFT VERTEBRAL ARTERY:  Patent antegrade flow. IMPRESSION: 1. Mild bilateral carotid atherosclerotic vascular disease with mild stenoses at both carotid bifurcations and proximal ICAs. Degree of stenosis less than 50% bilaterally. 2. Vertebral arteries are patent with antegrade flow. Electronically Signed   By: Maisie Fus  Register   On: 06/10/2016 10:08    Assessment: 82 y.o. female presenting with complaints of confusion, difficulty with vision and right sided weakness.  Neurological exam most significant at this time for [facility].  Head CT reviewed and shows a possible subacute left occipital infarct which explains findings of  [facility].  History and examination somewhat concerning though that there may have been a showering of emboli.  Further work up recommended.  Carotid dopplers show no evidence of hemodynamically significant stenosis.  Echocardiogram pending.  A1c pending, LDL 24.  Patient on Plavix and ASA at home.    Stroke Risk Factors - diabetes mellitus, hypertension and smoking  Plan: 1. MRI of the brain without contrast.  If embolic etiology more suggestive based on findings this may change management and further work up.   2. PT consult, OT consult, Speech consult 3. Prophylactic therapy-Continue ASA and Plavix 4. Telemetry monitoring 5. Frequent neuro checks 6. Smoking cessation counseling    Thana FarrLeslie Travin Marik,  MD Neurology [phone] 06/10/2016, 1:29 PM

## 2016-06-10 NOTE — Progress Notes (Signed)
OT Cancellation Note  Patient Details Name: Carol Kramer MRN: [id number] DOB: 1924-05-18   Cancelled Treatment:    Reason Eval/Treat Not Completed: Patient at procedure or test/ unavailable (Upon arrival, pt. was in the process of being transported to MRI. )  Olegario MessierElaine Sabena Winner, MS, OTR/L 06/10/2016, 3:33 PM

## 2016-06-10 NOTE — Evaluation (Addendum)
Physical Therapy Evaluation Patient Details Name: Carol Kramer MRN: [id number] DOB: 1924/06/11 Today's Date: 06/10/2016   History of Present Illness  presented to ER secondary to R > L visual changes, dizziness and unsteady gait; admitted with L occipital CVA.  Clinical Impression  Upon evaluation, patient alert and oriented to basic information; follow simple commands, but demonstrates limited insight into deficits and limited understanding of current health condition.  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  Mild R hemiparesis noted with functional strength at least 4-/5 throughout R hemi-body.  Patient denies sensory deficit-reports localization, proprioception intact; however, question some mild degree of proprioceptive deficit (tends to flex R UE with gait/exertion; patient unaware) with divided attention. Currently requiring consistent min assist for all functional activities; safety/indep optimized with use of RW at this time.  Constant cuing/instruction for R visual scanning and integration of appropriate compensatory strategies ('lighthouse scanning') to decrease fall risk within environment. Will plan to complete more formalized objective balance assessment next date. Would benefit from skilled PT to address above deficits and promote optimal return to PLOF; recommend transition to acute inpatient rehab upon discharge for high-intensity, post-acute rehab services.  Patient with good potential to return to mod indep level of functional ability with appropriate rehab services upon discharge.      Follow Up Recommendations CIR    Equipment Recommendations  Rolling walker with 5" wheels    Recommendations for Other Services       Precautions / Restrictions Precautions Precautions: Fall Restrictions Weight Bearing Restrictions: No      Mobility  Bed Mobility Overal bed mobility: Needs  Assistance Bed Mobility: Supine to Sit     Supine to sit: Min guard;Min assist     General bed mobility comments: increased time/effort for truncal elevation with transition towards R  Transfers Overall transfer level: Needs assistance Equipment used: None Transfers: Sit to/from Stand Sit to Stand: Min guard;Min assist         General transfer comment: broad BOS, requires UE support for external stabilization  Ambulation/Gait Ambulation/Gait assistance: Min assist Ambulation Distance (Feet): 75 Feet Assistive device: None       General Gait Details: broad BOS, reciprocal stepping pattern but inconsistent step height/length.  Decreased cadence/gait speed; max cuing for R visual scanning for way-finding and obstacle negotiation.  Stairs            Wheelchair Mobility    Modified Rankin (Stroke Patients Only)       Balance Overall balance assessment: Needs assistance Sitting-balance support: No upper extremity supported;Feet supported Sitting balance-Leahy Scale: Good     Standing balance support: No upper extremity supported Standing balance-Leahy Scale: Fair                               Pertinent Vitals/Pain Pain Assessment: No/denies pain    Home Living Family/patient expects to be discharged to:: Private residence Living Arrangements: Alone   Type of Home: House Home Access: Stairs to enter Entrance Stairs-Rails: None Entrance Stairs-Number of Steps: 2 Home Layout: One level Home Equipment: None      Prior Function Level of Independence: Independent         Comments: Indep with ADLs, household and community mobility; + driving.  Denies fall history.     Hand Dominance        Extremity/Trunk Assessment   Upper Extremity Assessment:  (R UE grossly  4-/5 throughout; denies sensory deficit)           Lower Extremity Assessment:  (R LE grossly 4/5 throughout; denies sensory deficit)      Cervical / Trunk  Assessment:  (elongation of R lateral trunk in static sitting, resulting in R lateral lean/weight shift)  Communication   Communication: No difficulties  Cognition Arousal/Alertness: Awake/alert Behavior During Therapy: WFL for tasks assessed/performed Overall Cognitive Status: Impaired/Different from baseline (oriented to self, location only; limited insight into deficits and functional implications.  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  Patient noting approx 25-50% visual field (midline to L) with visual scanning tasks; line bisection approx 25% from L--all suggestive of R visual field cut.  Significant difficulty with word/letter recognition-difficult to differentiate some degree of receptive aphasia vs. visual scanning problem.  Would benefit from OT consult to further assess/rehabilitate; discussed with Dr. Thad Rangereynolds who will order. Other Exercises: Reviewed use of 'lighthouse' technique for visual scanning during functional activities-patient voiced understanding, but demonstrated very limited carry-over and integration with mobility tasks. Other Exercises: 6975' with RW, cga/min assist--significant cuing/assist for visual scanning, walker management and obstacle negotiation to R of midline.  Does voice greater comfort/confidence with use of RW; will continue trials at this time to maximize safety/indep.   Assessment/Plan    PT Assessment Patient needs continued PT services  PT Problem List Decreased strength;Decreased activity tolerance;Decreased balance;Decreased mobility;Decreased cognition;Decreased knowledge of use of DME;Decreased safety awareness;Decreased knowledge of  precautions          PT Treatment Interventions DME instruction;Gait training;Stair training;Functional mobility training;Therapeutic activities;Therapeutic exercise;Balance training;Neuromuscular re-education;Patient/family education    PT Goals (Current goals can be found in the Care Plan section)  Acute Rehab PT Goals Patient Stated Goal: to get back home by myself PT Goal Formulation: With patient Time For Goal Achievement: 06/24/16 Potential to Achieve Goals: Good    Frequency 7X/week   Barriers to discharge        Co-evaluation               End of Session Equipment Utilized During Treatment: Gait belt Activity Tolerance: Patient tolerated treatment well Patient left: in chair;with call bell/phone within reach;with chair alarm set Nurse Communication: Mobility status         Time: [id number] PT Time Calculation (min) (ACUTE ONLY): 39 min   Charges:   PT Evaluation $PT Eval Moderate Complexity: 1 Procedure PT Treatments $Gait Training: 8-22 mins $Neuromuscular Re-education: 8-22 mins   PT G Codes:        Dinari Stgermaine H. Manson PasseyBrown, PT, DPT, NCS 06/10/16, 3:25 PM [phone]

## 2016-06-10 NOTE — Progress Notes (Signed)
Rehab Admissions Coordinator Note:  Patient was screened by Clois DupesBoyette, [place] Godwin for appropriateness for an Inpatient Acute Rehab Consult per PT and OT recommendation.  Noted pt refusing both CIR and SNF. [facility] 24/7 supervision of family.  Clois DupesBoyette, Davisha Linthicum Godwin 06/10/2016, 8:10 PM  I can be reached at [phone].

## 2016-06-10 NOTE — Care Management (Signed)
Admitted to [facility] with the diagnosis of CVA. Lives alone. Friend is Benard RinkJolanda Williams [phone]). Niece is Tammy [phone]). Dr. Maryjane HurterFeldpausch and Dr. Quillian QuinceBliss is listed as primary care physician. Takes care of all basic activities of daily living herself, drives.  Down for MRI Gwenette GreetBrenda s Wenzel Backlund RN MSN CCM Care Management

## 2016-06-10 NOTE — Clinical Social Work Note (Signed)
Clinical Social Work Assessment  Patient Details  Name: Carol Kramer MRN: [id number] Date of Birth: [birth year]  Date of referral:  06/10/16               Reason for consult:  Facility Placement                Permission sought to share information with:  Chartered certified accountant granted to share information::  No  Name::        Agency::     Relationship::     Contact Information:     Housing/Transportation Living arrangements for the past 2 months:  Single Family Home Source of Information:  Patient, Power of Attorney Patient Interpreter Needed:  None Criminal Activity/Legal Involvement Pertinent to Current Situation/Hospitalization:  No - Comment as needed Significant Relationships:  Other Family Members Lives with:  Self Do you feel safe going back to the place where you live?  Yes Need for family participation in patient care:  Yes (Comment)  Care giving concerns:  Patient lives alone in [place].    Social Worker assessment / plan:  Holiday representative ([place]) reviewed chart and noted that PT is recommending CIR. CSW met with patient to discuss SNF as a back up plan to CIR. Patient was alert and oriented and laying in the bed. CSW introduced self and explained role of CSW department. Patient reported that she lives alone in [place] and her niece [name] is her HPOA. CSW explained that patient is recommending CIR and explained what CIR was. CSW explained SNF VS. CIR. Patient reported that she is going home and refused to go to [facility] or SNF. Patient is agreeable to home health. CSW contacted patient's HPOA [name] and made her aware of above. Per HPOA she is not surprised that patient is refusing and is agreeable for patient to D/C home with home health. RN case manager aware of above.   Employment status:  Retired Forensic scientist:  Medicare PT Recommendations:  Inpatient [place] / Referral to community resources:  Other (Comment  Required) (Patient refused CIR and SNF. )  Patient/Family's Response to care:  Patient refused SNF and CIR.   Patient/Family's Understanding of and Emotional Response to Diagnosis, Current Treatment, and Prognosis:  Patient was pleasant and thanked CSW for visit.   Emotional Assessment Appearance:  Appears stated age Attitude/Demeanor/Rapport:    Affect (typically observed):  Appropriate, Calm Orientation:  Oriented to Self, Oriented to Place, Oriented to  Time, Oriented to Situation Alcohol / Substance use:  Not Applicable Psych involvement (Current and /or in the community):  No (Comment)  Discharge Needs  Concerns to be addressed:  Discharge Planning Concerns Readmission within the last 30 days:  No Current discharge risk:  Dependent with Mobility Barriers to Discharge:  Continued Medical Work up   [employer], Veronia Beets, LCSW 06/10/2016, 4:36 PM

## 2016-06-10 NOTE — Progress Notes (Signed)
*  PRELIMINARY RESULTS* Echocardiogram 2D Echocardiogram has been performed.  Cristela BlueHege, Darcus Edds 06/10/2016, 10:05 AM

## 2016-06-10 NOTE — Care Management Important Message (Signed)
Important Message  Patient Details  Name: Carol Kramer MRN: [id number] Date of Birth: [birth year]   Medicare Important Message Given:  Yes    Gwenette GreetBrenda S Loda Bialas, RN 06/10/2016, 9:55 AM

## 2016-06-10 NOTE — [place] FL2 (Signed)
[place] MEDICAID FL2 LEVEL OF CARE SCREENING TOOL     IDENTIFICATION  Patient Name: Carol Kramer Birthdate: [birth year] Sex: female Admission Date (Current Location): 06/09/2016  [place]   Facility and Address:  [facility], [address], [place], [place] [address]      Provider Number: [id number]  Attending Physician Name and Address:  Delfino LovettVipul Shah, MD  Relative Name and Phone Number:       Current Level of Care: Hospital Recommended Level of Care: Skilled Nursing Facility Prior Approval Number:    Date Approved/Denied:   PASRR Number:  [id number])  Discharge Plan: SNF    Current Diagnoses: Patient Active Problem List   Diagnosis Date Noted  . CVA (cerebral vascular accident) (HCC) 06/09/2016    Orientation RESPIRATION BLADDER Height & Weight     Self, Time, Situation, Place  Normal Continent Weight: 141 lb 9.6 oz (64.2 kg) Height:  5\' 4"  (162.6 cm)  BEHAVIORAL SYMPTOMS/MOOD NEUROLOGICAL BOWEL NUTRITION STATUS   (none)  (none) Continent Diet (Diet: Carb Modified )  AMBULATORY STATUS COMMUNICATION OF NEEDS Skin   Extensive Assist Verbally Normal                       Personal Care Assistance Level of Assistance  Bathing, Feeding, Dressing Bathing Assistance: Limited assistance Feeding assistance: Independent Dressing Assistance: Limited assistance     Functional Limitations Info  Sight, Hearing, Speech Sight Info: Adequate Hearing Info: Adequate Speech Info: Adequate    SPECIAL CARE FACTORS FREQUENCY  PT (By licensed PT), OT (By licensed OT)     PT Frequency:  (5) OT Frequency:  (5)            Contractures      Additional Factors Info  Code Status, Allergies Code Status Info:  (Full Code. ) Allergies Info:  (No Known Allergies. )           Current Medications (06/10/2016):  This is the current hospital active medication list Current Facility-Administered Medications   Medication Dose Route Frequency Provider Last Rate Last Dose  . acetaminophen (TYLENOL) tablet 650 mg  650 mg Oral Q4H PRN Enedina FinnerSona Patel, MD       Or  . acetaminophen (TYLENOL) suppository 650 mg  650 mg Rectal Q4H PRN Enedina FinnerSona Patel, MD      . aspirin EC tablet 81 mg  81 mg Oral Daily Enedina FinnerSona Patel, MD      . clopidogrel (PLAVIX) tablet 75 mg  75 mg Oral Daily Enedina FinnerSona Patel, MD   75 mg at 06/10/16 [id number]  . enoxaparin (LOVENOX) injection 30 mg  30 mg Subcutaneous Q24H Enedina FinnerSona Patel, MD   30 mg at 06/09/16 2101  . hydrochlorothiazide (HYDRODIURIL) tablet 25 mg  25 mg Oral Daily Enedina FinnerSona Patel, MD   25 mg at 06/10/16 [id number]  . insulin aspart (novoLOG) injection 0-9 Units  0-9 Units Subcutaneous TID WC Enedina FinnerSona Patel, MD   1 Units at 06/10/16 1236  . labetalol (NORMODYNE,TRANDATE) injection 10 mg  10 mg Intravenous Q2H PRN Oralia Manisavid Willis, MD   10 mg at 06/10/16 0032  . lisinopril (PRINIVIL,ZESTRIL) tablet 40 mg  40 mg Oral Daily Enedina FinnerSona Patel, MD   40 mg at 06/10/16 [id number]  . metFORMIN (GLUCOPHAGE) tablet 850 mg  850 mg Oral BID WC Enedina FinnerSona Patel, MD   850 mg at 06/10/16 [id number]  . omega-3 acid ethyl esters (LOVAZA) capsule 1 g  1 g Oral Daily Sona  Allena KatzPatel, MD   1 g at 06/10/16 [id number]  . pioglitazone (ACTOS) tablet 45 mg  45 mg Oral Daily Enedina FinnerSona Patel, MD   45 mg at 06/10/16 [id number]  . pravastatin (PRAVACHOL) tablet 40 mg  40 mg Oral q1800 Enedina FinnerSona Patel, MD   40 mg at 06/09/16 1803     Discharge Medications: Please see discharge summary for a list of discharge medications.  Relevant Imaging Results:  Relevant Lab Results:   Additional Information  (SSN: [id number])  Illana Nolting, Darleen CrockerBailey M, LCSW

## 2016-06-10 NOTE — Progress Notes (Signed)
[facility] [place] [facility]   PATIENT NAME: Carol CovertDorothy Kramer    MR#:  [id number]  DATE OF BIRTH:  1923-08-26  SUBJECTIVE:  CHIEF COMPLAINT:   Chief Complaint  Patient presents with  . Extremity Weakness  persistent rt sided weakness, waiting for MRI REVIEW OF SYSTEMS:  Review of Systems  Constitutional: Negative for chills, fever and weight loss.  HENT: Negative for nosebleeds and sore throat.   Eyes: Negative for blurred vision.  Respiratory: Negative for cough, shortness of breath and wheezing.   Cardiovascular: Negative for chest pain, orthopnea, leg swelling and PND.  Gastrointestinal: Negative for abdominal pain, constipation, diarrhea, heartburn, nausea and vomiting.  Genitourinary: Negative for dysuria and urgency.  Musculoskeletal: Negative for back pain.  Skin: Negative for rash.  Neurological: Positive for focal weakness. Negative for dizziness, speech change and headaches.  Endo/Heme/Allergies: Does not bruise/bleed easily.  Psychiatric/Behavioral: Negative for depression.   DRUG ALLERGIES:  No Known Allergies VITALS:  Blood pressure (!) 175/67, pulse 76, temperature 98.6 F (37 C), resp. rate 18, height 5\' 4"  (1.626 m), weight 64.2 kg (141 lb 9.6 oz), SpO2 93 %. PHYSICAL EXAMINATION:  Physical Exam  Constitutional: She is well-developed, well-nourished, and in no distress.  HENT:  Head: Normocephalic and atraumatic.  Eyes: Conjunctivae and EOM are normal. Pupils are equal, round, and reactive to light.  Neck: Normal range of motion. Neck supple. No tracheal deviation present. No thyromegaly present.  Cardiovascular: Normal rate, regular rhythm and normal heart sounds.   Pulmonary/Chest: Effort normal and breath sounds normal. No respiratory distress. She has no wheezes. She exhibits no tenderness.  Abdominal: Soft. Bowel sounds are normal. She exhibits no distension. There is no tenderness.  Musculoskeletal: Normal range of motion.    Neurological: She is alert. She is disoriented. No cranial nerve deficit.  Pleasantly confused  Skin: Skin is warm and dry. No rash noted.  Psychiatric: Mood and affect normal.   LABORATORY PANEL:   CBC  Recent Labs Lab 06/09/16 1155  WBC 6.2  HGB 15.7  HCT 46.6  PLT 196   ------------------------------------------------------------------------------------------------------------------ Chemistries   Recent Labs Lab 06/09/16 1155  NA 137  K 4.1  CL 100*  CO2 27  GLUCOSE 99  BUN 54*  CREATININE 1.30*  CALCIUM 9.3   RADIOLOGY:  [place] Carotid Bilateral (at [facility] Only)  Result Date: 06/10/2016 CLINICAL DATA:  CVA. EXAM: BILATERAL CAROTID DUPLEX ULTRASOUND TECHNIQUE: Wallace CullensGray scale imaging, color Doppler and duplex ultrasound were performed of bilateral carotid and vertebral arteries in the neck. COMPARISON:  No recent prior. FINDINGS: Criteria: Quantification of carotid stenosis is based on velocity parameters that correlate the residual internal carotid diameter with NASCET-based stenosis levels, using the diameter of the distal internal carotid lumen as the denominator for stenosis measurement. The following velocity measurements were obtained: RIGHT ICA:  81/9 cm/sec CCA:  92/8 cm/sec SYSTOLIC ICA/CCA RATIO:  1.0 DIASTOLIC ICA/CCA RATIO:  1.5 ECA:  221 cm/sec LEFT ICA:  129/20 cm/sec CCA:  84/10 cm/sec SYSTOLIC ICA/CCA RATIO:  1.5 DIASTOLIC ICA/CCA RATIO:  2.0 ECA:  161 cm/sec RIGHT CAROTID ARTERY: Mild right common carotid and carotid bifurcation/proximal ICA atherosclerotic vascular disease with mild calcified atherosclerotic plaque at noted at the right carotid bifurcation. No flow limiting stenosis. RIGHT VERTEBRAL ARTERY:  Patent with antegrade flow. LEFT CAROTID ARTERY: Mild left carotid bifurcation proximal ICA atherosclerotic vascular disease with mild calcified atherosclerotic plaque at the left carotid bifurcation proximal ICA. LEFT VERTEBRAL ARTERY:  Patent  antegrade  flow. IMPRESSION: 1. Mild bilateral carotid atherosclerotic vascular disease with mild stenoses at both carotid bifurcations and proximal ICAs. Degree of stenosis less than 50% bilaterally. 2. Vertebral arteries are patent with antegrade flow. Electronically Signed   By: Maisie Fushomas  Register   On: 06/10/2016 10:08   ASSESSMENT AND PLAN:  Carol Kramer  is a 82 y.o. female with a known history of Diabetes, hypertension comes to the emergency room from home after she started noticing blurred vision along with dizziness and unsteady gait. Patient sees her blurred vision was more on the right side than the left  1. Acute CVA, left occipital -Patient presented with blurred vision bilateral right more than left along with dizziness and unsteady gait -Continue aspirin and Plavix - Appreciate Neuro input -Carotid Doppler not showing significant stenosis, echo wnl - Neuro recommends MRI of the brain - pending - PT recommends inpt rehab (per nursing) - waiting for note. D/w CM   2. Hypertension -Continue hydrochlorothiazide, lisinopril  3. Diabetes Continue glipizide, metformin, Actos -Sliding-scale insulin  4. Hyperlipidemia on pravastatin 40 mg  5. DVT prophylaxis subcutaneous Lovenox     All the records are reviewed and case discussed with Care Management/Social Worker. Management plans discussed with the patient, family and they are in agreement.  CODE STATUS: FULL CODE  TOTAL TIME TAKING CARE OF THIS PATIENT: 35 minutes.   More than 50% of the time was spent in counseling/coordination of care: YES  POSSIBLE D/C IN 1-2 DAYS, DEPENDING ON CLINICAL CONDITION. AND INPT REHAB status and insurance approval   Delfino LovettVipul Roel Douthat M.D on 06/10/2016 at 2:54 PM  Between 7am to 6pm - Pager - [phone]  After 6pm go to [email or web address] - Social research officer, governmentpassword [email or web address]  [facility] [place] [facility]  Office  [phone]  CC: Primary care physician; Dortha KernBLISS, LAURA K, MD  Note: This dictation was  prepared with Dragon dictation along with smaller phrase technology. Any transcriptional errors that result from this process are unintentional.

## 2016-06-11 LAB — GLUCOSE, CAPILLARY
GLUCOSE-CAPILLARY: 118 mg/dL — AB (ref 65–99)
Glucose-Capillary: 138 mg/dL — ABNORMAL HIGH (ref 65–99)
Glucose-Capillary: 147 mg/dL — ABNORMAL HIGH (ref 65–99)

## 2016-06-11 LAB — HEMOGLOBIN A1C
Hgb A1c MFr Bld: 8 % — ABNORMAL HIGH (ref 4.8–5.6)
MEAN PLASMA GLUCOSE: 183 mg/dL

## 2016-06-11 NOTE — Progress Notes (Signed)
Physical Therapy Treatment Patient Details Name: Sherron MondayDorothy Z Gurevich MRN: [id number] DOB: 11-13-1923 Today's Date: 06/11/2016    History of Present Illness presented to ER secondary to R > L visual changes, dizziness and unsteady gait; admitted with L occipital CVA.    PT Comments    Pt in bed agrees to session with min encouragement.  Transitioned out of bed with min assist to move hips towards edge of bed.  Once sitting she was able to maintain balance without support.  Stood and was able to ambulate around nursing unit with rolling walker and min guard.  She did require verbal cues for visual scanning and to avoid obstacles on right side.  She did well with walker overall.    Chart review noted she was screened for CIR and she had refused CIR or SNF.  Due to right visual field deficits, she is at an increased fall risk and would benefit from further therapy/education to manage safely at home.   Follow Up Recommendations  SNF     Equipment Recommendations  Rolling walker with 5" wheels    Recommendations for Other Services       Precautions / Restrictions Precautions Precautions: Fall Restrictions Weight Bearing Restrictions: No    Mobility  Bed Mobility Overal bed mobility: Needs Assistance Bed Mobility: Supine to Sit     Supine to sit: Min guard;Min assist     General bed mobility comments: increased time, assist to move hips to edge of bed  Transfers Overall transfer level: Needs assistance Equipment used: None Transfers: Sit to/from Stand Sit to Stand: Min guard;Min assist            Ambulation/Gait Ambulation/Gait assistance: Min guard Ambulation Distance (Feet): 160 Feet Assistive device: Rolling walker (2 wheeled) Gait Pattern/deviations: Step-through pattern   Gait velocity interpretation: Below normal speed for age/gender General Gait Details: pt did well with rolling walker today, verbal cues for visual scanning   Stairs             Wheelchair Mobility    Modified Rankin (Stroke Patients Only)       Balance Overall balance assessment: Needs assistance Sitting-balance support: No upper extremity supported Sitting balance-Leahy Scale: Good     Standing balance support: No upper extremity supported Standing balance-Leahy Scale: Fair                      Cognition Arousal/Alertness: Awake/alert Behavior During Therapy: WFL for tasks assessed/performed Overall Cognitive Status: Impaired/Different from baseline                      Exercises      General Comments        Pertinent Vitals/Pain Pain Assessment: No/denies pain    Home Living                      Prior Function            PT Goals (current goals can now be found in the care plan section) Progress towards PT goals: Progressing toward goals    Frequency    7X/week      PT Plan Current plan remains appropriate    Co-evaluation             End of Session Equipment Utilized During Treatment: Gait belt Activity Tolerance: Patient tolerated treatment well Patient left: in chair;with call bell/phone within reach;with chair alarm set     Time: [id number] PT Time  Calculation (min) (ACUTE ONLY): 13 min  Charges:  $Gait Training: 8-22 mins                    G Codes:      Danielle DessSarah Trayvond Viets 06/11/2016, 1:01 PM

## 2016-06-11 NOTE — Discharge Summary (Addendum)
[facility] [place] [facility]   PATIENT NAME: Carol Kramer    MR#:  [id number]  DATE OF BIRTH:  [birth year]  DATE OF ADMISSION:  06/09/2016 ADMITTING PHYSICIAN: Enedina Finner, MD  DATE OF DISCHARGE: 06/11/16  PRIMARY CARE PHYSICIAN: BLISS, Doreene Nest, MD    ADMISSION DIAGNOSIS:  Ischemic stroke (HCC) [I63.9]  DISCHARGE DIAGNOSIS:  Acute/Subacute left PCA infarct  SECONDARY DIAGNOSIS:   Past Medical History:  Diagnosis Date  . Diabetes mellitus without complication (HCC)   . Hypertension     HOSPITAL COURSE:  Carol Kramer a 82 y.o. femalewith a known history of Diabetes, hypertension comes to the emergency room from home after she started noticing blurred vision along with dizziness and unsteady gait. Patient sees her blurred vision was more on the right side than the left  1. Acute CVA, left occipital (left PCA) -Patient presented with blurred vision bilateral right more than left along with dizziness and unsteady gait -Continue aspirin and Plavix - Appreciate Neuro input -Carotid Doppler not showing significant stenosis, echo wnl - Neuro recommends MRI of the brain -results reviewed - PT recommends inpt rehab (per nursing) - waiting for note. D/w CM  -pt declines it. Niece [name] aware. HHPT arranged holter monitor for 48 hours place. Niece aware to return after 72 hours  2. Hypertension -Continue hydrochlorothiazide, lisinopril  3. Diabetes Continue glipizide, metformin, Actos -Sliding-scale insulin  4. Hyperlipidemia on pravastatin 40 mg  5. DVT prophylaxis subcutaneous Lovenox  D/c home CONSULTS OBTAINED:  Treatment Team:  Kym Groom, MD Thana Farr, MD  DRUG ALLERGIES:  No Known Allergies  DISCHARGE MEDICATIONS:   Current Discharge Medication List    CONTINUE these medications which have NOT CHANGED   Details  aspirin EC 81 MG tablet Take 81 mg by mouth daily.    clopidogrel (PLAVIX) 75 MG tablet  Take 75 mg by mouth daily.    glipiZIDE (GLUCOTROL) 5 MG tablet Take 5 mg by mouth daily.    hydrochlorothiazide (HYDRODIURIL) 25 MG tablet Take 25 mg by mouth daily.    Krill Oil 300 MG CAPS Take 1 capsule by mouth daily.    lisinopril (PRINIVIL,ZESTRIL) 40 MG tablet Take 40 mg by mouth daily.    lovastatin (MEVACOR) 40 MG tablet Take 40 mg by mouth at bedtime.    metFORMIN (GLUCOPHAGE) 850 MG tablet Take 850 mg by mouth 2 (two) times daily.    pioglitazone (ACTOS) 45 MG tablet Take 45 mg by mouth daily.        If you experience worsening of your admission symptoms, develop shortness of breath, life threatening emergency, suicidal or homicidal thoughts you must seek medical attention immediately by calling 911 or calling your MD immediately  if symptoms less severe.  You Must read complete instructions/literature along with all the possible adverse reactions/side effects for all the Medicines you take and that have been prescribed to you. Take any new Medicines after you have completely understood and accept all the possible adverse reactions/side effects.   Please note  You were cared for by a hospitalist during your hospital stay. If you have any questions about your discharge medications or the care you received while you were in the hospital after you are discharged, you can call the unit and asked to speak with the hospitalist on call if the hospitalist that took care of you is not available. Once you are discharged, your primary care physician will handle any further medical issues. Please note that NO  REFILLS for any discharge medications will be authorized once you are discharged, as it is imperative that you return to your primary care physician (or establish a relationship with a primary care physician if you do not have one) for your aftercare needs so that they can reassess your need for medications and monitor your lab values. Today   SUBJECTIVE   Doing well. Wants to go  home  VITAL SIGNS:  Blood pressure (!) 180/64, pulse 75, temperature 97.5 F (36.4 C), temperature source Oral, resp. rate 20, height 5\' 4"  (1.626 m), weight 64.2 kg (141 lb 9.6 oz), SpO2 94 %.  I/O:   Intake/Output Summary (Last 24 hours) at 06/11/16 1317 Last data filed at 06/11/16 0900  Gross per 24 hour  Intake              360 ml  Output              400 ml  Net              -40 ml    PHYSICAL EXAMINATION:  GENERAL:  82 y.o.-year-old patient lying in the bed with no acute distress.  EYES: Pupils equal, round, reactive to light and accommodation. No scleral icterus. Extraocular muscles intact.  HEENT: Head atraumatic, normocephalic. Oropharynx and nasopharynx clear.  NECK:  Supple, no jugular venous distention. No thyroid enlargement, no tenderness.  LUNGS: Normal breath sounds bilaterally, no wheezing, rales,rhonchi or crepitation. No use of accessory muscles of respiration.  CARDIOVASCULAR: S1, S2 normal. No murmurs, rubs, or gallops.  ABDOMEN: Soft, non-tender, non-distended. Bowel sounds present. No organomegaly or mass.  EXTREMITIES: No pedal edema, cyanosis, or clubbing.  NEUROLOGIC: Cranial nerves II through XII are intact. Muscle strength 5/5 in all extremities. Sensation intact. Gait not checked.  PSYCHIATRIC: The patient is alert and oriented x 3.  SKIN: No obvious rash, lesion, or ulcer.   DATA REVIEW:   CBC   Recent Labs Lab 06/09/16 1155  WBC 6.2  HGB 15.7  HCT 46.6  PLT 196    Chemistries   Recent Labs Lab 06/09/16 1155  NA 137  K 4.1  CL 100*  CO2 27  GLUCOSE 99  BUN 54*  CREATININE 1.30*  CALCIUM 9.3    Microbiology Results   No results found for this or any previous visit (from the past 240 hour(s)).  RADIOLOGY:  Dg Chest 2 View  Result Date: 06/09/2016 CLINICAL DATA:  Confusion, altered mental status and slurred speech. EXAM: CHEST  2 VIEW COMPARISON:  06/17/2008 FINDINGS: The cardiomediastinal silhouette is unremarkable. Mild  interstitial prominence is unchanged. There is no evidence of focal airspace disease, pulmonary edema, suspicious pulmonary nodule/mass, pleural effusion, or pneumothorax. No acute bony abnormalities are identified. IMPRESSION: No active cardiopulmonary disease. Electronically Signed   By: Harmon PierJeffrey  Hu M.D.   On: 06/09/2016 13:36   Ct Head Wo Contrast  Result Date: 06/09/2016 CLINICAL DATA:  Right-sided weakness and slurred speech EXAM: CT HEAD WITHOUT CONTRAST TECHNIQUE: Contiguous axial images were obtained from the base of the skull through the vertex without intravenous contrast. COMPARISON:  None. FINDINGS: Brain: Mild atrophic changes are identified. There is a geographic area of decreased attenuation identified in the left occipital lobe consistent with subacute ischemia. No other area of acute ischemia is seen. No findings to suggest acute hemorrhage or space-occupying mass lesion are noted. Vascular: No hyperdense vessel or unexpected calcification. Skull: Normal. Negative for fracture or focal lesion. Sinuses/Orbits: No acute finding. Other: None. IMPRESSION: Changes  consistent with likely subacute ischemia in the left occipital lobe No other focal abnormality is noted. Electronically Signed   By: Alcide Clever M.D.   On: 06/09/2016 13:56   Mr Brain Wo Contrast  Result Date: 06/10/2016 CLINICAL DATA:  Right homonymous hemianopia. Slurred speech and right-sided weakness. EXAM: MRI HEAD WITHOUT CONTRAST TECHNIQUE: Multiplanar, multiecho pulse sequences of the brain and surrounding structures were obtained without intravenous contrast. COMPARISON:  Head CT 06/09/2016 FINDINGS: Brain: There is a relatively large, acute to early subacute left PCA territory infarct involving the occipital lobe and small amount of the posterior temporal and posterior inferior parietal lobes. There are also small, patchy, acute to early subacute left ACA territory infarcts involving the left cingulate gyrus and corpus  callosum. There is no evidence of associated hemorrhage with either of these recent infarcts. Chronic microhemorrhages are noted in the left cerebellum. There are chronic lacunar infarcts in the left basal ganglia with associated chronic blood products. Chronic lacunar infarcts are also noted in the right corona radiata. No mass, midline shift, or extra-axial fluid collection is identified. Cerebral atrophy is within normal limits for age. Small foci of T2 hyperintensity scattered throughout the cerebral white matter bilaterally are nonspecific but compatible with mild chronic small vessel ischemic disease. Vascular: Major intracranial vascular flow voids are preserved. Skull and upper cervical spine: Unremarkable bone marrow signal. Sinuses/Orbits: Prior bilateral cataract extraction. Minimal posterior right ethmoid air cell mucosal thickening and trace bilateral mastoid effusions. Other: None. IMPRESSION: 1. Large, acute to early subacute left PCA infarct. 2. Small, acute to early subacute left ACA infarcts. 3. Mild chronic small vessel ischemic disease. Electronically Signed   By: Sebastian Ache M.D.   On: 06/10/2016 16:10   US Carotid Bilateral (at [facility] Only)  Result Date: 06/10/2016 CLINICAL DATA:  CVA. EXAM: BILATERAL CAROTID DUPLEX ULTRASOUND TECHNIQUE: Wallace Cullens scale imaging, color Doppler and duplex ultrasound were performed of bilateral carotid and vertebral arteries in the neck. COMPARISON:  No recent prior. FINDINGS: Criteria: Quantification of carotid stenosis is based on velocity parameters that correlate the residual internal carotid diameter with NASCET-based stenosis levels, using the diameter of the distal internal carotid lumen as the denominator for stenosis measurement. The following velocity measurements were obtained: RIGHT ICA:  81/9 cm/sec CCA:  92/8 cm/sec SYSTOLIC ICA/CCA RATIO:  1.0 DIASTOLIC ICA/CCA RATIO:  1.5 ECA:  221 cm/sec LEFT ICA:  129/20 cm/sec CCA:  84/10 cm/sec SYSTOLIC  ICA/CCA RATIO:  1.5 DIASTOLIC ICA/CCA RATIO:  2.0 ECA:  161 cm/sec RIGHT CAROTID ARTERY: Mild right common carotid and carotid bifurcation/proximal ICA atherosclerotic vascular disease with mild calcified atherosclerotic plaque at noted at the right carotid bifurcation. No flow limiting stenosis. RIGHT VERTEBRAL ARTERY:  Patent with antegrade flow. LEFT CAROTID ARTERY: Mild left carotid bifurcation proximal ICA atherosclerotic vascular disease with mild calcified atherosclerotic plaque at the left carotid bifurcation proximal ICA. LEFT VERTEBRAL ARTERY:  Patent antegrade flow. IMPRESSION: 1. Mild bilateral carotid atherosclerotic vascular disease with mild stenoses at both carotid bifurcations and proximal ICAs. Degree of stenosis less than 50% bilaterally. 2. Vertebral arteries are patent with antegrade flow. Electronically Signed   By: Maisie Fus  Register   On: 06/10/2016 10:08     Management plans discussed with the patient, family and they are in agreement.  CODE STATUS:     Code Status Orders        Start     Ordered   06/09/16 1653  Full code  Continuous  06/09/16 1652    Code Status History    Date Active Date Inactive Code Status Order ID Comments User Context   This patient has a current code status but no historical code status.      TOTAL TIME TAKING CARE OF THIS PATIENT: 40 minutes.    Nelline Lio M.D on 06/11/2016 at 1:17 PM  Between 7am to 6pm - Pager - [phone] After 6pm go to [email or web address] - password [email or web address] [facility]  [facility]  Office  [phone]  CC: Primary care physician; Dortha KernBLISS, LAURA K, MD

## 2016-06-11 NOTE — Discharge Instructions (Signed)
HOLTER MONITOR FOR48 hours  HHPT

## 2016-06-11 NOTE — Progress Notes (Signed)
Subjective: Patient without new neurological complaints  Objective: Current vital signs: BP (!) 180/64   Pulse 75   Temp 97.5 F (36.4 C) (Oral)   Resp 20   Ht 5\' 4"  (1.626 m)   Wt 64.2 kg (141 lb 9.6 oz)   SpO2 94%   BMI 24.31 kg/m  Vital signs in last 24 hours: Temp:  [97.5 F (36.4 C)-98.2 F (36.8 C)] 97.5 F (36.4 C) (12/02 0853) Pulse Rate:  [75-87] 75 (12/02 0853) Resp:  [17-20] 20 (12/02 0417) BP: (165-190)/(64-77) 180/64 (12/02 0853) SpO2:  [92 %-95 %] 94 % (12/02 0853)  Intake/Output from previous day: 12/01 0701 - 12/02 0700 In: 360 [P.O.:360] Out: 250 [Urine:250] Intake/Output this shift: Total I/O In: 120 [P.O.:120] Out: 150 [Urine:150] Nutritional status: Diet Carb Modified Fluid consistency: Thin; Room service appropriate? Yes  Neurologic Exam: Mental Status: Alert, some confusion noted.  Speech fluent without evidence of aphasia.  Able to follow simple commands without difficulty.  Needs some reinforcement for 3-step commands.   Cranial Nerves: II: Discs flat bilaterally; RHH, pupils equal, round, reactive to light and accommodation III,IV, VI: ptosis not present, extra-ocular motions intact bilaterally V,VII: smile symmetric, facial light touch sensation normal bilaterally VIII: hearing normal bilaterally IX,X: gag reflex present XI: bilateral shoulder shrug XII: midline tongue extension Motor: Right :  Upper extremity   5/5                                      Left:     Upper extremity   5/5             Lower extremity   5/5                                                  Lower extremity   5/5 Tone and bulk:normal tone throughout; no atrophy noted Sensory: Pinprick and light touch intact throughout, bilaterally Gait: ambulating with walker  Lab Results: Basic Metabolic Panel:  Recent Labs Lab 06/09/16 1155  NA 137  K 4.1  CL 100*  CO2 27  GLUCOSE 99  BUN 54*  CREATININE 1.30*  CALCIUM 9.3    Liver Function Tests: No results for  input(s): AST, ALT, ALKPHOS, BILITOT, PROT, ALBUMIN in the last 168 hours. No results for input(s): LIPASE, AMYLASE in the last 168 hours. No results for input(s): AMMONIA in the last 168 hours.  CBC:  Recent Labs Lab 06/09/16 1155  WBC 6.2  HGB 15.7  HCT 46.6  MCV 84.5  PLT 196    Cardiac Enzymes:  Recent Labs Lab 06/09/16 1155  TROPONINI <0.03    Lipid Panel:  Recent Labs Lab 06/10/16 0404  CHOL 114  TRIG 327*  HDL 25*  CHOLHDL 4.6  VLDL 65*  LDLCALC 24    CBG:  Recent Labs Lab 06/10/16 1224 06/10/16 1637 06/10/16 2047 06/11/16 0727 06/11/16 1131  GLUCAP 144* 152* 108* 138* 147*    Microbiology: No results found for this or any previous visit.  Coagulation Studies: No results for input(s): LABPROT, INR in the last 72 hours.  Imaging: Dg Chest 2 View  Result Date: 06/09/2016 CLINICAL DATA:  Confusion, altered mental status and slurred speech. EXAM: CHEST  2 VIEW COMPARISON:  06/17/2008 FINDINGS: The cardiomediastinal  silhouette is unremarkable. Mild interstitial prominence is unchanged. There is no evidence of focal airspace disease, pulmonary edema, suspicious pulmonary nodule/mass, pleural effusion, or pneumothorax. No acute bony abnormalities are identified. IMPRESSION: No active cardiopulmonary disease. Electronically Signed   By: Harmon PierJeffrey  Hu M.D.   On: 06/09/2016 13:36   Ct Head Wo Contrast  Result Date: 06/09/2016 CLINICAL DATA:  Right-sided weakness and slurred speech EXAM: CT HEAD WITHOUT CONTRAST TECHNIQUE: Contiguous axial images were obtained from the base of the skull through the vertex without intravenous contrast. COMPARISON:  None. FINDINGS: Brain: Mild atrophic changes are identified. There is a geographic area of decreased attenuation identified in the left occipital lobe consistent with subacute ischemia. No other area of acute ischemia is seen. No findings to suggest acute hemorrhage or space-occupying mass lesion are noted.  Vascular: No hyperdense vessel or unexpected calcification. Skull: Normal. Negative for fracture or focal lesion. Sinuses/Orbits: No acute finding. Other: None. IMPRESSION: Changes consistent with likely subacute ischemia in the left occipital lobe No other focal abnormality is noted. Electronically Signed   By: Alcide CleverMark  Lukens M.D.   On: 06/09/2016 13:56   Mr Brain Wo Contrast  Result Date: 06/10/2016 CLINICAL DATA:  Right homonymous hemianopia. Slurred speech and right-sided weakness. EXAM: MRI HEAD WITHOUT CONTRAST TECHNIQUE: Multiplanar, multiecho pulse sequences of the brain and surrounding structures were obtained without intravenous contrast. COMPARISON:  Head CT 06/09/2016 FINDINGS: Brain: There is a relatively large, acute to early subacute left PCA territory infarct involving the occipital lobe and small amount of the posterior temporal and posterior inferior parietal lobes. There are also small, patchy, acute to early subacute left ACA territory infarcts involving the left cingulate gyrus and corpus callosum. There is no evidence of associated hemorrhage with either of these recent infarcts. Chronic microhemorrhages are noted in the left cerebellum. There are chronic lacunar infarcts in the left basal ganglia with associated chronic blood products. Chronic lacunar infarcts are also noted in the right corona radiata. No mass, midline shift, or extra-axial fluid collection is identified. Cerebral atrophy is within normal limits for age. Small foci of T2 hyperintensity scattered throughout the cerebral white matter bilaterally are nonspecific but compatible with mild chronic small vessel ischemic disease. Vascular: Major intracranial vascular flow voids are preserved. Skull and upper cervical spine: Unremarkable bone marrow signal. Sinuses/Orbits: Prior bilateral cataract extraction. Minimal posterior right ethmoid air cell mucosal thickening and trace bilateral mastoid effusions. Other: None. IMPRESSION:  1. Large, acute to early subacute left PCA infarct. 2. Small, acute to early subacute left ACA infarcts. 3. Mild chronic small vessel ischemic disease. Electronically Signed   By: Sebastian AcheAllen  Grady M.D.   On: 06/10/2016 16:10   [place] Carotid Bilateral (at [facility] Only)  Result Date: 06/10/2016 CLINICAL DATA:  CVA. EXAM: BILATERAL CAROTID DUPLEX ULTRASOUND TECHNIQUE: Wallace CullensGray scale imaging, color Doppler and duplex ultrasound were performed of bilateral carotid and vertebral arteries in the neck. COMPARISON:  No recent prior. FINDINGS: Criteria: Quantification of carotid stenosis is based on velocity parameters that correlate the residual internal carotid diameter with NASCET-based stenosis levels, using the diameter of the distal internal carotid lumen as the denominator for stenosis measurement. The following velocity measurements were obtained: RIGHT ICA:  81/9 cm/sec CCA:  92/8 cm/sec SYSTOLIC ICA/CCA RATIO:  1.0 DIASTOLIC ICA/CCA RATIO:  1.5 ECA:  221 cm/sec LEFT ICA:  129/20 cm/sec CCA:  84/10 cm/sec SYSTOLIC ICA/CCA RATIO:  1.5 DIASTOLIC ICA/CCA RATIO:  2.0 ECA:  161 cm/sec RIGHT CAROTID ARTERY: Mild right  common carotid and carotid bifurcation/proximal ICA atherosclerotic vascular disease with mild calcified atherosclerotic plaque at noted at the right carotid bifurcation. No flow limiting stenosis. RIGHT VERTEBRAL ARTERY:  Patent with antegrade flow. LEFT CAROTID ARTERY: Mild left carotid bifurcation proximal ICA atherosclerotic vascular disease with mild calcified atherosclerotic plaque at the left carotid bifurcation proximal ICA. LEFT VERTEBRAL ARTERY:  Patent antegrade flow. IMPRESSION: 1. Mild bilateral carotid atherosclerotic vascular disease with mild stenoses at both carotid bifurcations and proximal ICAs. Degree of stenosis less than 50% bilaterally. 2. Vertebral arteries are patent with antegrade flow. Electronically Signed   By: Maisie Fus  Register   On: 06/10/2016 10:08    Medications:  I have  reviewed the patient's current medications. Scheduled: . aspirin EC  81 mg Oral Daily  . clopidogrel  75 mg Oral Daily  . enoxaparin (LOVENOX) injection  30 mg Subcutaneous Q24H  . hydrochlorothiazide  25 mg Oral Daily  . insulin aspart  0-9 Units Subcutaneous TID WC  . lisinopril  40 mg Oral Daily  . metFORMIN  850 mg Oral BID WC  . omega-3 acid ethyl esters  1 g Oral Daily  . pioglitazone  45 mg Oral Daily  . pravastatin  40 mg Oral q1800    Assessment/Plan: Patient stable.  MRI of the brain reviewed and shows a large acute left PCA infarct and small left frontal infarcts.  On ASA and Plavix.  Recommendations: 1.  Holter as an outpatient 2.  Continue ASA and Plavix 3.  Continue therapy   LOS: 2 days   Thana Farr, MD Neurology [phone] 06/11/2016  1:00 PM

## 2016-06-11 NOTE — Care Management Note (Signed)
Case Management Note  Patient Details  Name: Sherron MondayDorothy Z Krisher MRN: [id number] Date of Birth: [birth year]  Subjective/Objective:       82yo Mrs Lind CovertDorothy Vogan who lives alone is now refusing SNF per Dr Enedina FinnerSona Patel. Mrs Art BuffRebman is agreeable to have home health RN and PT however, and chose [facility] to be her provider. Mrs Art BuffRebman does not meet criteria for CIR.  A referral for home health PT and RN was faxed to [facility].             Action/Plan:   Expected Discharge Date:                  Expected Discharge Plan:     In-House Referral:     Discharge planning Services     Post Acute Care Choice:    Choice offered to:     DME Arranged:    DME Agency:     HH Arranged:    HH Agency:     Status of Service:     If discussed at [employer] Length of Stay Meetings, dates discussed:    Additional Comments:  Alisha Bacus A, RN 06/11/2016, 1:39 PM

## 2016-06-11 NOTE — Progress Notes (Signed)
Discussed discharge instructions and medications with pt and her niece. IV removed. All questions addressed. RT placed 48hr holster monitor.  Pt transported home via car by her niece.

## 2016-06-11 NOTE — Progress Notes (Signed)
OT Cancellation Note  Patient Details Name: Carol Kramer MRN: [id number] DOB: [birth year]   Cancelled Treatment:    Reason Eval/Treat Not Completed: Fatigue/lethargy limiting ability to participate. Pt asleep upon entry, awoke to OT's voice and light tactile cue, declined OT eval at this time. Will attempt to evaluate/treat later today once patient is more alert.   Eliezer BottomJamie L Stiller, OTR/L 06/11/2016, 9:57 AM

## 2016-06-11 NOTE — Evaluation (Signed)
Occupational Therapy Evaluation Patient Details Name: Carol MondayDorothy Z Tsukamoto MRN: [id number] DOB: [birth year] Today's Date: 06/11/2016    History of Present Illness 82yo pt presented to ER secondary to R > L visual changes, dizziness and unsteady gait; admitted with L occipital CVA.   Clinical Impression   Pt presents with cognitive impairments and visual field deficits following occipital CVA leading to functional deficits in self care tasks. Pt would benefit from additional skilled OT services to address noted impairments and related functional deficits. Recommend HHOT, as pt refuses CIR or SNF options.     Follow Up Recommendations  Home health OT;Supervision/Assistance - 24 hour (pt declines CIR or SNF; [facility] and [facility])    Equipment Recommendations  Tub/shower seat    Recommendations for Other Services       Precautions / Restrictions Precautions Precautions: Fall Restrictions Weight Bearing Restrictions: No      Mobility Bed Mobility Overal bed mobility: Needs Assistance Bed Mobility: Supine to Sit     Supine to sit: Min guard;Min assist     General bed mobility comments: increased time, assist to move hips to edge of bed  Transfers Overall transfer level: Needs assistance Equipment used: None Transfers: Sit to/from Stand Sit to Stand: Min guard;Min assist              Balance Overall balance assessment: Needs assistance Sitting-balance support: No upper extremity supported Sitting balance-Leahy Scale: Good     Standing balance support: No upper extremity supported Standing balance-Leahy Scale: Fair                              ADL Overall ADL's : Needs assistance/impaired Eating/Feeding: Minimal assistance;Set up;Sitting;Cueing for compensatory techinques Eating/Feeding Details (indicate cue type and reason): Pt required set up of lunch tray to adjust positioning of food items to left side of tray, min assist to open  salad dressing packet, unable to correctly ID grapes even after tasting; required verbal cues and visual cues to attend to right side in order to find silverware                                   General ADL Comments: Pt requires min assist with all functional tasks with varying levels of cuing for compensatory strategies, attending to R side, safety     Vision Vision Assessment?: Yes Eye Alignment: Within Functional Limits Ocular Range of Motion: Within Functional Limits Alignment/Gaze Preference: Within Defined Limits Tracking/Visual Pursuits: Requires cues, head turns, or add eye shifts to track Saccades: Decreased speed of saccadic movement Visual Fields: Right homonymous hemianopsia Depth Perception: Undershoots   Perception     Praxis      Pertinent Vitals/Pain Pain Assessment: No/denies pain     Hand Dominance     Extremity/Trunk Assessment Upper Extremity Assessment Upper Extremity Assessment: RUE deficits/detail RUE Deficits / Details: RUE grossly 4-/5 RUE Coordination: decreased fine motor (difficulty opening salad dressing packet, required assistance)   Lower Extremity Assessment Lower Extremity Assessment: Defer to PT evaluation       Communication Communication Communication: No difficulties   Cognition Arousal/Alertness: Awake/alert Behavior During Therapy: WFL for tasks assessed/performed Overall Cognitive Status: Difficult to assess Area of Impairment: Memory;Problem solving;Safety/judgement     Memory: Decreased short-term memory   Safety/Judgement: Decreased awareness of deficits;Decreased awareness of safety (pt unable to identify call bell initially,  required verbal instruction and additional cues to recall how to call for help)   Problem Solving: Slow processing;Requires verbal cues General Comments: Pt had significant difficulty with identifying objects by name (trees, grapes on lunch tray), required visual and verbal cues to  correctly identify letters in a large font word (DINING), unable to read   General Comments       Exercises       Shoulder Instructions      Home Living Family/patient expects to be discharged to:: Private residence Living Arrangements: Alone   Type of Home: House Home Access: Stairs to enter Secretary/administratorntrance Stairs-Number of Steps: 2 Entrance Stairs-Rails:  (pt stated "I think I do") Home Layout: One level (1 level with basement)     Bathroom Shower/Tub: Tub/shower unit Shower/tub characteristics: Engineer, building servicesCurtain Bathroom Toilet: Handicapped height Bathroom Accessibility:  ("I think so")   Home Equipment: Cane - single point (pt reports having approx 4 canes at home but does not need them)          Prior Functioning/Environment Level of Independence: Independent        Comments: Indep with ADLs, household and community mobility; + driving.  Denies fall history.        OT Problem List: Impaired vision/perception;Decreased safety awareness;Decreased cognition;Decreased coordination   OT Treatment/Interventions: Self-care/ADL training;Therapeutic activities;Therapeutic exercise;Visual/perceptual remediation/compensation;Cognitive remediation/compensation;Patient/family education;Neuromuscular education    OT Goals(Current goals can be found in the care plan section) Acute Rehab OT Goals Patient Stated Goal: to go home OT Goal Formulation: With patient Time For Goal Achievement: 06/25/16 Potential to Achieve Goals: Good  OT Frequency: Min 1X/week   Barriers to D/C: Decreased caregiver support  pt reports she has 2 neices that live locally but no one would be able to provide assistance at home; however per chart review, neice (HCPOA) has confirmed family can provide 24/7 supervision       Co-evaluation              End of Session    Activity Tolerance: Patient tolerated treatment well Patient left: in chair;with call bell/phone within reach   Time: 1200-1235 OT Time  Calculation (min): 35 min Charges:  OT General Charges $OT Visit: 1 Procedure OT Evaluation $OT Eval Moderate Complexity: 1 Procedure OT Treatments $Self Care/Home Management : 8-22 mins G-Codes:    Eliezer BottomJamie L Stiller, OTR/L 06/11/2016, 1:47 PM

## 2016-06-12 NOTE — Care Management Note (Signed)
Case Management Note  Patient Details  Name: Sherron MondayDorothy Z Gayton MRN: [id number] Date of Birth: 11-Jan-1924  Subjective/Objective:      Call niece Benard RinkJolanda Williams [phone], for appointments and other needs per Mrs Art BuffRebman is very hard of hearing.  Mrs Hamada's address is [address], [place], [place] [id number].             Action/Plan:   Expected Discharge Date:                  Expected Discharge Plan:     In-House Referral:     Discharge planning Services     Post Acute Care Choice:    Choice offered to:     DME Arranged:    DME Agency:     HH Arranged:    HH Agency:     Status of Service:     If discussed at [employer] Length of Stay Meetings, dates discussed:    Additional Comments:  Caoilainn Sacks A, RN 06/12/2016, 12:31 PM

## 2016-06-20 ENCOUNTER — Ambulatory Visit: Payer: Self-pay

## 2017-01-08 DEATH — deceased

## 2018-05-05 IMAGING — US US CAROTID DUPLEX BILAT
1 series · 13 of 24 positions shown · non-contrast
Comparison: No recent prior.

CLINICAL DATA: CVA.

EXAM:
BILATERAL CAROTID DUPLEX ULTRASOUND
TECHNIQUE: Gray scale imaging, color Doppler and duplex ultrasound were
performed of bilateral carotid and vertebral arteries in the neck.

[Series 1: us carotid duplex bilat · 13 of 64 slices shown]
[im 1/64]
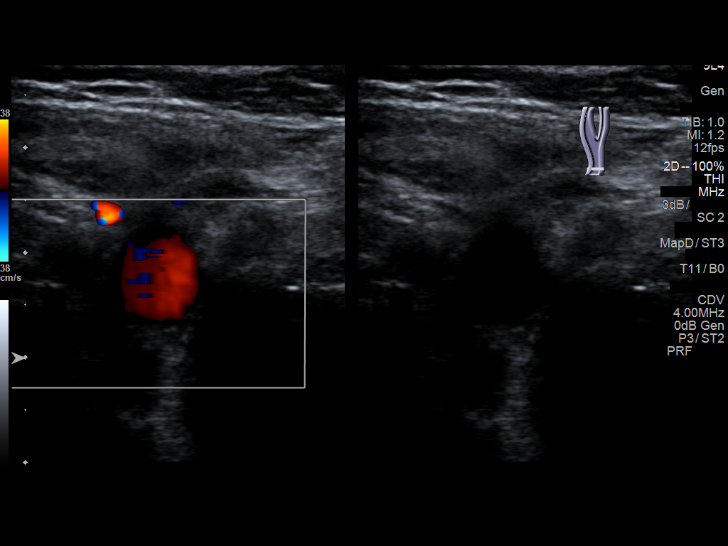
[im 6/64]
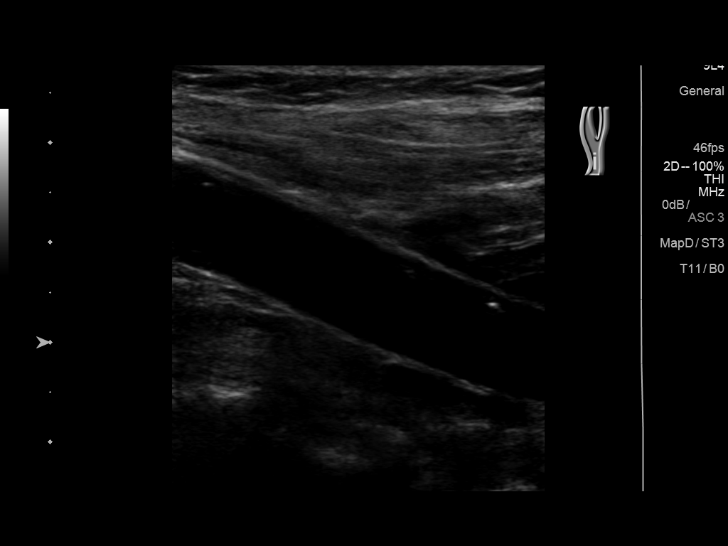
[im 11/64]
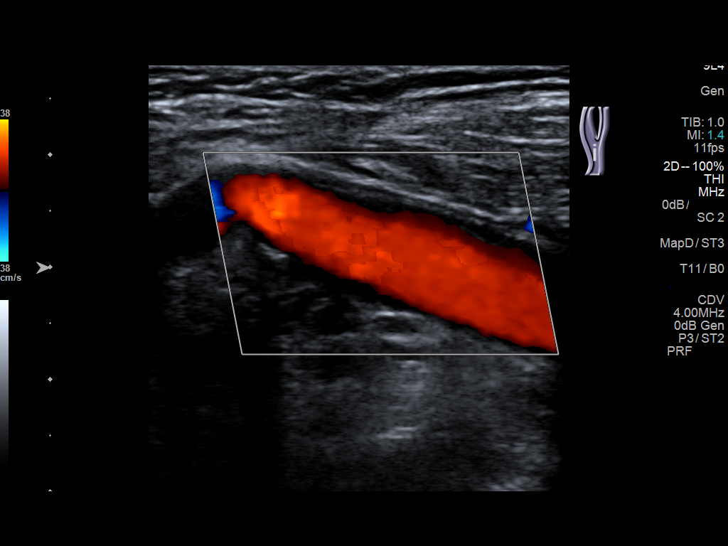
[im 17/64]
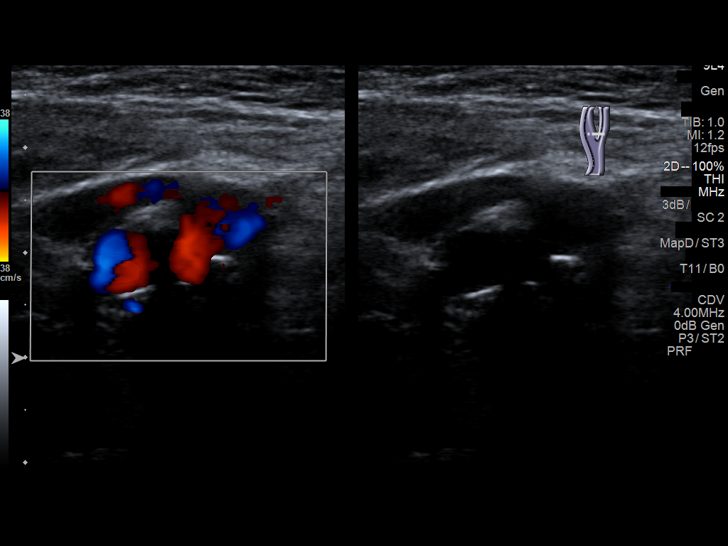
[im 22/64]
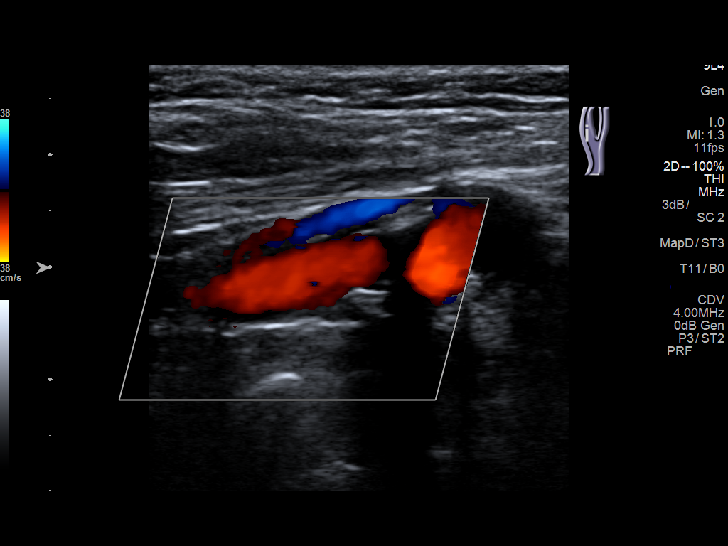
[im 28/64]
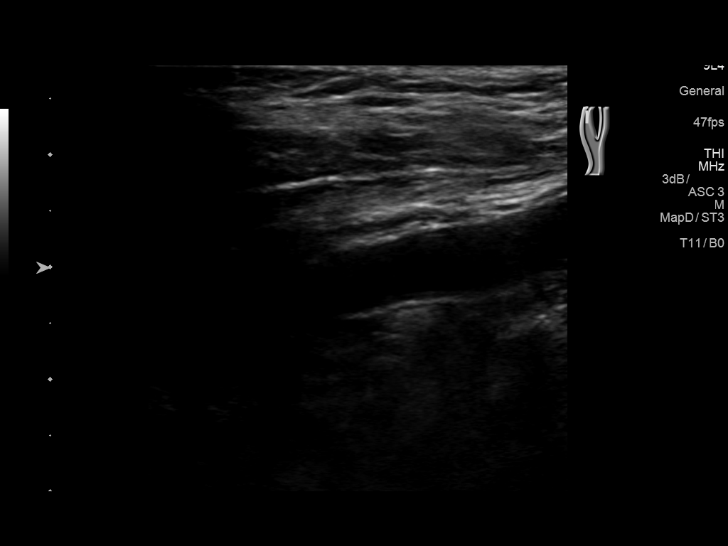
[im 33/64]
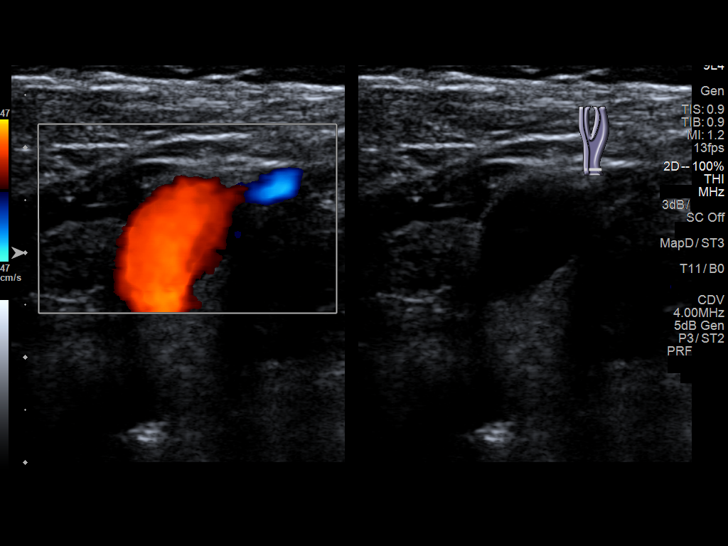
[im 36/64]
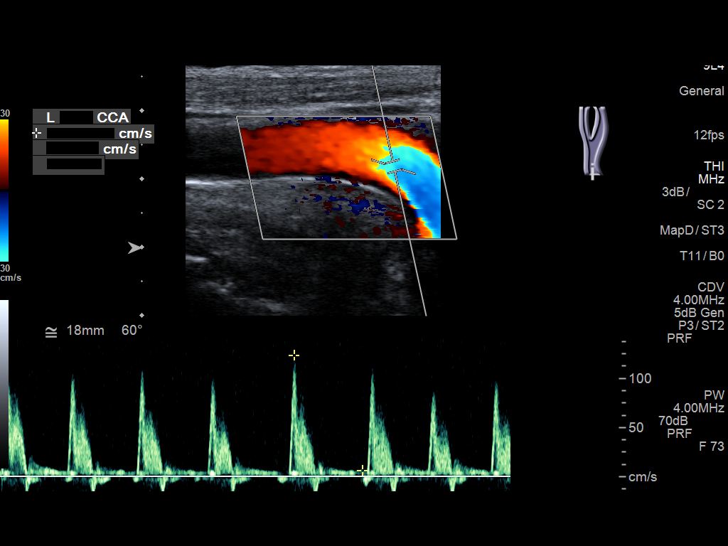
[im 42/64]
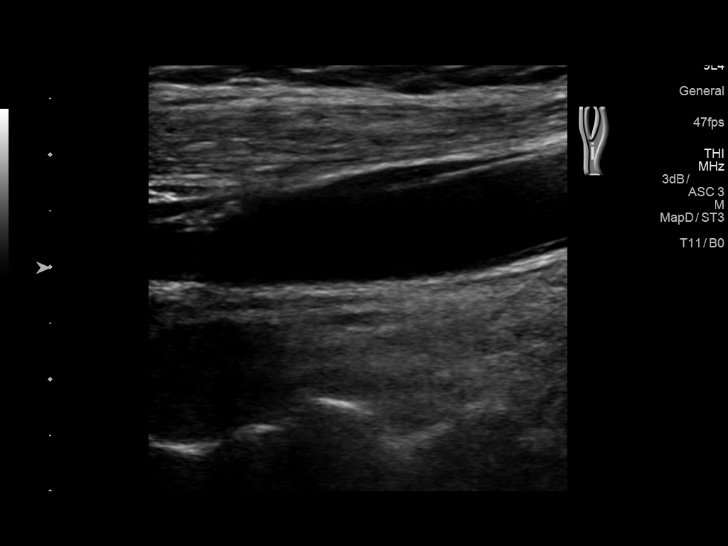
[im 47/64]
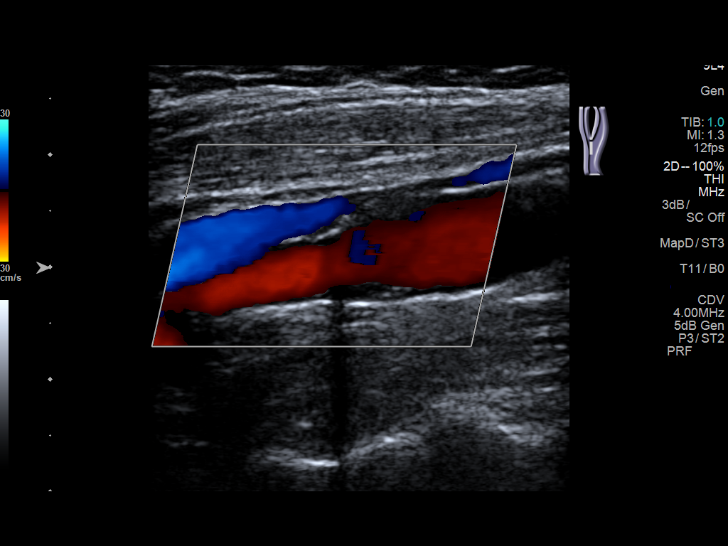
[im 53/64]
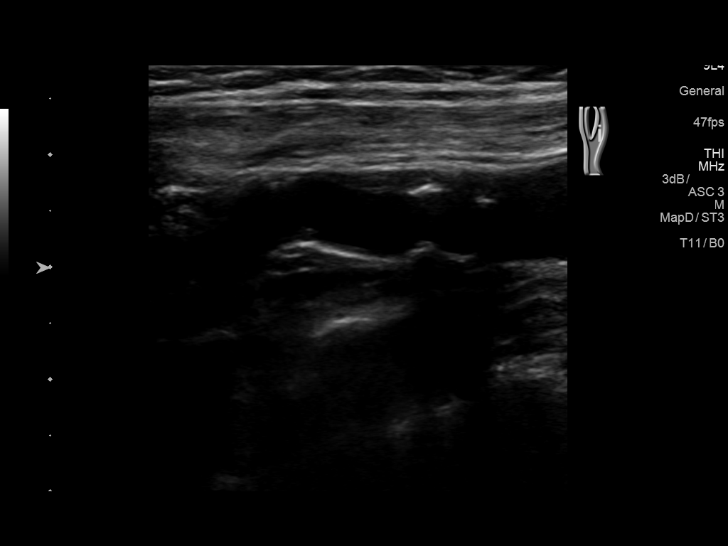
[im 58/64]
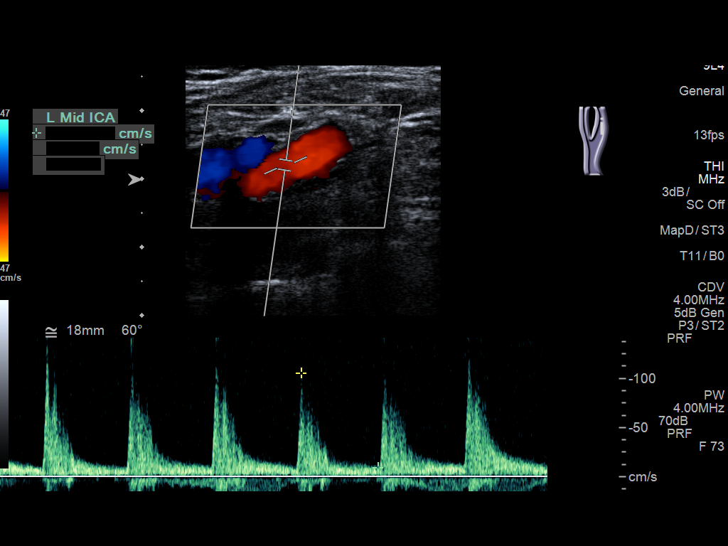
[im 64/64]
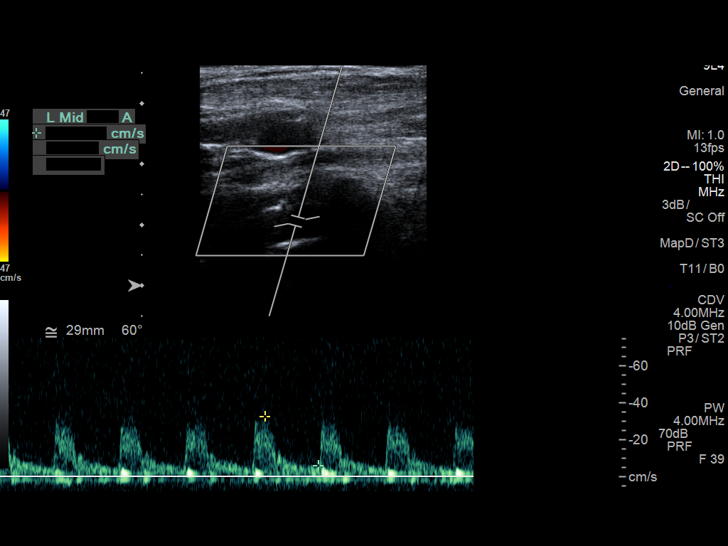

[13 of 24 positions shown; findings below may reference images not displayed]

FINDINGS: Criteria: Quantification of carotid stenosis is based on velocity
parameters that correlate the residual internal carotid diameter
with NASCET-based stenosis levels, using the diameter of the distal
internal carotid lumen as the denominator for stenosis measurement.

The following velocity measurements were obtained:

RIGHT

ICA:  81/9 cm/sec

CCA:  92/8 cm/sec

SYSTOLIC ICA/CCA RATIO:

DIASTOLIC ICA/CCA RATIO:

ECA:  221 cm/sec

LEFT

ICA:  129/20 cm/sec

CCA:  84/10 cm/sec

SYSTOLIC ICA/CCA RATIO:

DIASTOLIC ICA/CCA RATIO:

ECA:  161 cm/sec

RIGHT CAROTID ARTERY: Mild right common carotid and carotid
bifurcation/proximal ICA atherosclerotic vascular disease with mild
calcified atherosclerotic plaque at noted at the right carotid
bifurcation. No flow limiting stenosis.

RIGHT VERTEBRAL ARTERY:  Patent with antegrade flow.

LEFT CAROTID ARTERY: Mild left carotid bifurcation proximal ICA
atherosclerotic vascular disease with mild calcified atherosclerotic
plaque at the left carotid bifurcation proximal ICA.

LEFT VERTEBRAL ARTERY:  Patent antegrade flow.
IMPRESSION: 1. Mild bilateral carotid atherosclerotic vascular disease with mild
stenoses at both carotid bifurcations and proximal ICAs. Degree of
stenosis less than 50% bilaterally.

2. Vertebral arteries are patent with antegrade flow.
